# Patient Record
Sex: Female | Born: 1960
Health system: Southern US, Community
[De-identification: ages and names within clinical notes are randomized; demographics above are authoritative.]

## PROBLEM LIST (undated history)

## (undated) DIAGNOSIS — I10 Essential (primary) hypertension: Secondary | ICD-10-CM

## (undated) HISTORY — PX: ABDOMINAL HYSTERECTOMY: SHX81

---

## 2002-04-26 ENCOUNTER — Ambulatory Visit (HOSPITAL_BASED_OUTPATIENT_CLINIC_OR_DEPARTMENT_OTHER): Admission: RE | Admit: 2002-04-26 | Discharge: 2002-04-26 | Payer: Self-pay | Admitting: Obstetrics and Gynecology

## 2002-06-18 ENCOUNTER — Inpatient Hospital Stay (HOSPITAL_COMMUNITY): Admission: RE | Admit: 2002-06-18 | Discharge: 2002-06-20 | Payer: Self-pay | Admitting: Obstetrics and Gynecology

## 2013-11-10 ENCOUNTER — Emergency Department (HOSPITAL_COMMUNITY)
Admission: EM | Admit: 2013-11-10 | Discharge: 2013-11-10 | Disposition: A | Discharge status: Home / Self Care | Payer: BC Managed Care – PPO | Attending: Emergency Medicine | Admitting: Emergency Medicine

## 2013-11-10 ENCOUNTER — Emergency Department (HOSPITAL_COMMUNITY): Payer: BC Managed Care – PPO

## 2013-11-10 ENCOUNTER — Encounter (HOSPITAL_COMMUNITY): Payer: Self-pay | Admitting: *Deleted

## 2013-11-10 DIAGNOSIS — S82001A Unspecified fracture of right patella, initial encounter for closed fracture: Secondary | ICD-10-CM

## 2013-11-10 DIAGNOSIS — Y9389 Activity, other specified: Secondary | ICD-10-CM | POA: Insufficient documentation

## 2013-11-10 DIAGNOSIS — S82031A Displaced transverse fracture of right patella, initial encounter for closed fracture: Secondary | ICD-10-CM | POA: Insufficient documentation

## 2013-11-10 DIAGNOSIS — W01198A Fall on same level from slipping, tripping and stumbling with subsequent striking against other object, initial encounter: Secondary | ICD-10-CM | POA: Insufficient documentation

## 2013-11-10 DIAGNOSIS — Y9289 Other specified places as the place of occurrence of the external cause: Secondary | ICD-10-CM | POA: Diagnosis not present

## 2013-11-10 DIAGNOSIS — S0083XA Contusion of other part of head, initial encounter: Secondary | ICD-10-CM | POA: Diagnosis not present

## 2013-11-10 DIAGNOSIS — S82002A Unspecified fracture of left patella, initial encounter for closed fracture: Secondary | ICD-10-CM

## 2013-11-10 DIAGNOSIS — R52 Pain, unspecified: Secondary | ICD-10-CM

## 2013-11-10 DIAGNOSIS — S0990XA Unspecified injury of head, initial encounter: Secondary | ICD-10-CM | POA: Diagnosis present

## 2013-11-10 DIAGNOSIS — I10 Essential (primary) hypertension: Secondary | ICD-10-CM | POA: Diagnosis not present

## 2013-11-10 DIAGNOSIS — W010XXA Fall on same level from slipping, tripping and stumbling without subsequent striking against object, initial encounter: Secondary | ICD-10-CM

## 2013-11-10 DIAGNOSIS — S82032A Displaced transverse fracture of left patella, initial encounter for closed fracture: Secondary | ICD-10-CM | POA: Insufficient documentation

## 2013-11-10 HISTORY — DX: Essential (primary) hypertension: I10

## 2013-11-10 HISTORY — PX: JOINT ASPIRATION KNEE: PRO81

## 2013-11-10 MED ORDER — ONDANSETRON 8 MG PO TBDP
8.0000 mg | ORAL_TABLET | Freq: Once | ORAL | Status: AC
  Administered 2013-11-10: 8 mg via ORAL
  Filled 2013-11-10: qty 1

## 2013-11-10 MED ORDER — HYDROCODONE-ACETAMINOPHEN 5-325 MG PO TABS
2.0000 | ORAL_TABLET | Freq: Once | ORAL | Status: AC
  Administered 2013-11-10: 2 via ORAL
  Filled 2013-11-10: qty 2

## 2013-11-10 MED ORDER — OXYCODONE-ACETAMINOPHEN 5-325 MG PO TABS: 1.0000 | ORAL_TABLET | Freq: Four times a day (QID) | ORAL | Status: DC | PRN

## 2013-11-10 MED ORDER — LIDOCAINE HCL 1 % IJ SOLN
INTRAMUSCULAR | Status: AC
  Administered 2013-11-10: 20 mL
  Filled 2013-11-10: qty 20

## 2013-11-10 MED ORDER — OXYCODONE HCL 5 MG PO TABS: 5.0000 mg | ORAL_TABLET | ORAL | Status: DC | PRN

## 2013-11-10 NOTE — ED Notes (Signed)
Pt reports she tripped in her carport this morning around 0930, pt thinks she fell sideways, pt has large contusion to upper right foreheard and c/o bil knee pain 7/10. Pt alert and oriented x4, denies LOC.

## 2013-11-10 NOTE — Discharge Instructions (Signed)
It was our pleasure to provide your ER care today - we hope that you feel better.  Take motrin or aleve as need for pain. You may also take percocet as need for pain. No driving when taking percocet. Also, do not take tylenol or acetaminophen containing medication when taking percocet.  Wear knee immobilizers, use crutches. Elevate legs, ice/coldpacks to sore areas.   Follow up with orthopedist in the next couple days as arrange with them.  No driving until cleared to do so by your doctor/orthopedist.  Return to ER if worse, new symptoms, numbness/weakness, severe headache, persistent vomiting, other concern.    Fall Prevention and Home Safety Falls cause injuries and can affect all age groups. It is possible to use preventive measures to significantly decrease the likelihood of falls. There are many simple measures which can make your home safer and prevent falls. OUTDOORS  Repair cracks and edges of walkways and driveways.  Remove high doorway thresholds.  Trim shrubbery on the main path into your home.  Have good outside lighting.  Clear walkways of tools, rocks, debris, and clutter.  Check that handrails are not broken and are securely fastened. Both sides of steps should have handrails.  Have leaves, snow, and ice cleared regularly.  Use sand or salt on walkways during winter months.  In the garage, clean up grease or oil spills. BATHROOM  Install night lights.  Install grab bars by the toilet and in the tub and shower.  Use non-skid mats or decals in the tub or shower.  Place a plastic non-slip stool in the shower to sit on, if needed.  Keep floors dry and clean up all water on the floor immediately.  Remove soap buildup in the tub or shower on a regular basis.  Secure bath mats with non-slip, double-sided rug tape.  Remove throw rugs and tripping hazards from the floors. BEDROOMS  Install night lights.  Make sure a bedside light is easy to reach.  Do  not use oversized bedding.  Keep a telephone by your bedside.  Have a firm chair with side arms to use for getting dressed.  Remove throw rugs and tripping hazards from the floor. KITCHEN  Keep handles on pots and pans turned toward the center of the stove. Use back burners when possible.  Clean up spills quickly and allow time for drying.  Avoid walking on wet floors.  Avoid hot utensils and knives.  Position shelves so they are not too high or low.  Place commonly used objects within easy reach.  If necessary, use a sturdy step stool with a grab bar when reaching.  Keep electrical cables out of the way.  Do not use floor polish or wax that makes floors slippery. If you must use wax, use non-skid floor wax.  Remove throw rugs and tripping hazards from the floor. STAIRWAYS  Never leave objects on stairs.  Place handrails on both sides of stairways and use them. Fix any loose handrails. Make sure handrails on both sides of the stairways are as long as the stairs.  Check carpeting to make sure it is firmly attached along stairs. Make repairs to worn or loose carpet promptly.  Avoid placing throw rugs at the top or bottom of stairways, or properly secure the rug with carpet tape to prevent slippage. Get rid of throw rugs, if possible.  Have an electrician put in a light switch at the top and bottom of the stairs. OTHER FALL PREVENTION TIPS  Wear low-heel  or rubber-soled shoes that are supportive and fit well. Wear closed toe shoes.  When using a stepladder, make sure it is fully opened and both spreaders are firmly locked. Do not climb a closed stepladder.  Add color or contrast paint or tape to grab bars and handrails in your home. Place contrasting color strips on first and last steps.  Learn and use mobility aids as needed. Install an electrical emergency response system.  Turn on lights to avoid dark areas. Replace light bulbs that burn out immediately. Get light  switches that glow.  Arrange furniture to create clear pathways. Keep furniture in the same place.  Firmly attach carpet with non-skid or double-sided tape.  Eliminate uneven floor surfaces.  Select a carpet pattern that does not visually hide the edge of steps.  Be aware of all pets. OTHER HOME SAFETY TIPS 1. Set the water temperature for 120 F (48.8 C). 2. Keep emergency numbers on or near the telephone. 3. Keep smoke detectors on every level of the home and near sleeping areas. Document Released: 12/11/2001 Document Revised: 06/22/2011 Document Reviewed: 03/12/2011 [facility] Patient Information 2015 [place], [place]. This information is not intended to replace advice given to you by your health care provider. Make sure you discuss any questions you have with your health care provider.     Patellar Fracture, Adult A patellar fracture is a break in your kneecap (patella).  CAUSES   A direct blow to the knee or a fall is usually the cause of a broken patella.  A very hard and strong bending of your knee can cause a patellar fracture. RISK FACTORS Involvement in contact sports, especially sports that involve a lot of jumping. SIGNS AND SYMPTOMS   Tender and swollen knee.  Pain when you move your knee, especially when you try to straighten out your leg.  Difficulty walking or putting weight on your knee.  Misshapen knee (as if a bone is out of place). DIAGNOSIS  Patellar fracture is usually diagnosed with a physical exam and an X-ray exam. TREATMENT  Treatment depends on the type of fracture:  If your patella is still in the right position after the fracture and you can still straighten your leg out, you can usually be treated with a splint or cast for 4-6 weeks.  If your patella is broken into multiple small pieces but you are able to straighten your leg, you can usually be treated with a splint or cast for 4-6 weeks. Sometimes your patella may need to be removed before  the cast is applied.  If you cannot straighten out your leg after a patellar fracture, then surgery is required to hold the bony fragments together until they heal. A cast or splint will be applied for 4-6 weeks. HOME CARE INSTRUCTIONS   Only take over-the-counter or prescription medicines for pain, discomfort, or fever as directed by your health care provider.  Use crutches as directed, and exercise the leg as directed.  Apply ice to the injured area:  Put ice in a plastic bag.  Place a towel between your skin and the bag.  Leave the ice on for 20 minutes, 2-3 times a day.  Elevate the affected knee above the level of your heart. SEEK MEDICAL CARE IF:  You suspect you have significantly injured your knee.  You hear a pop after a knee injury.  Your knee is misshapen after a knee injury.  You have pain when you move your knee.  You have difficulty walking  or putting weight on your knee.  You cannot fully move your knee. SEEK IMMEDIATE MEDICAL CARE IF:  You have redness, swelling, or increasing pain in your knee.  You have a fever. Document Released: 09/19/2002 Document Revised: 10/11/2012 Document Reviewed: 08/02/2012 [facility] Patient Information 2015 [place], [place]. This information is not intended to replace advice given to you by your health care provider. Make sure you discuss any questions you have with your health care provider.    Knee Immobilizer A knee immobilizer is used to support and protect an injured or painful knee. Knee immobilizers keep your knee from being used while it is healing. Some of the common immobilizers used include splints (air, plaster, fiberglass, stiff cloth, or aluminum) or casts. Wear your knee immobilizer as instructed and only remove it as instructed. HOME CARE INSTRUCTIONS   Use absorbent powder (such as baby powder or talcum powder) to control irritation from sweat and friction.  Adjust the immobilizer to be firm but not tight.  Signs of an immobilizer that is too tight include:  Swelling.  Numbness.  Color change in your foot or ankle.  Increased pain.  While resting, raise your leg above the level of your heart. Pillows can be used for support. This reduces throbbing and helps healing.  Remove the immobilizer to bathe and sleep. SEEK MEDICAL CARE IF:   You have increasing pain or swelling in the knee, foot, or ankle.  You have problems caused by the knee immobilizer, or it breaks or needs replacement. MAKE SURE YOU:   Understand these instructions.  Will watch your condition.  Will get help right away if you are not doing well or get worse. Document Released: 12/21/2004 Document Revised: 05/07/2013 Document Reviewed: 08/14/2012 [facility] Patient Information 2015 [place], [place]. This information is not intended to replace advice given to you by your health care provider. Make sure you discuss any questions you have with your health care provider.    Crutch Use Crutches are used to take weight off one of your legs or feet when you stand or walk. It is important to use crutches that fit properly. When fitted properly:  Each crutch should be 2-3 finger widths below the armpit.  Your weight should be supported by your hand, and not by resting the armpit on the crutch.  RISKS AND COMPLICATIONS Damage to the nerves that extend from your armpit to your hand and arm. To prevent this from happening, make sure your crutches fit properly and do not put pressure on your armpit when using them. HOW TO USE YOUR CRUTCHES If you have been instructed to use partial weight bearing, apply (bear) the amount of weight as your health care provider suggests. Do not bear weight in an amount that causes pain to the area of injury. Walking  Step with the crutches.  Swing the healthy leg slightly ahead of the crutches. Going Up Steps If there is no handrail:  Step up with the healthy leg.  Step up with the crutches  and injured leg.  Continue in this way. If there is a handrail:  Hold both crutches in one hand.  Place your free hand on the handrail.  While putting your weight on your arms, lift your healthy leg to the step.  Bring the crutches and the injured leg up to that step.  Continue in this way. Going Down Steps Be very careful, as going down stairs with crutches is very challenging. If there is no handrail:  Step down with  the injured leg and crutches.  Step down with the healthy leg. If there is a handrail:  Place your hand on the handrail.  Hold both crutches with your free hand.  Lower your injured leg and crutch to the step below you. Make sure to keep the crutch tips in the center of the step, never on the edge.  Lower your healthy leg to that step.  Continue in this way. Standing Up 4. Hold the injured leg forward. 5. Grab the armrest with one hand and the top of the crutches with the other hand. 6. Using these supports, pull yourself up to a standing position. Sitting Down 1. Hold the injured leg forward. 2. Grab the armrest with one hand and the top of the crutches with the other hand. 3. Lower yourself to a sitting position. SEEK MEDICAL CARE IF:  You still feel unsteady on your feet.  You develop new pain, for example in your armpits, back, shoulder, wrist, or hip.  You develop any numbness or tingling. SEEK IMMEDIATE MEDICAL CARE IF: You fall. Document Released: 12/19/1999 Document Revised: 12/26/2012 Document Reviewed: 08/28/2012 [facility] Patient Information 2015 [place], [place]. This information is not intended to replace advice given to you by your health care provider. Make sure you discuss any questions you have with your health care provider.    Facial or Scalp Contusion A facial or scalp contusion is a deep bruise on the face or head. Injuries to the face and head generally cause a lot of swelling, especially around the eyes. Contusions are the  result of an injury that caused bleeding under the skin. The contusion may turn blue, purple, or yellow. Minor injuries will give you a painless contusion, but more severe contusions may stay painful and swollen for a few weeks.  CAUSES  A facial or scalp contusion is caused by a blunt injury or trauma to the face or head area.  SIGNS AND SYMPTOMS   Swelling of the injured area.   Discoloration of the injured area.   Tenderness, soreness, or pain in the injured area.  DIAGNOSIS  The diagnosis can be made by taking a medical history and doing a physical exam. An X-ray exam, CT scan, or MRI may be needed to determine if there are any associated injuries, such as broken bones (fractures). TREATMENT  Often, the best treatment for a facial or scalp contusion is applying cold compresses to the injured area. Over-the-counter medicines may also be recommended for pain control.  HOME CARE INSTRUCTIONS   Only take over-the-counter or prescription medicines as directed by your health care provider.   Apply ice to the injured area.   Put ice in a plastic bag.   Place a towel between your skin and the bag.   Leave the ice on for 20 minutes, 2-3 times a day.  SEEK MEDICAL CARE IF:  You have bite problems.   You have pain with chewing.   You are concerned about facial defects. SEEK IMMEDIATE MEDICAL CARE IF:  You have severe pain or a headache that is not relieved by medicine.   You have unusual sleepiness, confusion, or personality changes.   You throw up (vomit).   You have a persistent nosebleed.   You have double vision or blurred vision.   You have fluid drainage from your nose or ear.   You have difficulty walking or using your arms or legs.  MAKE SURE YOU:   Understand these instructions.  Will watch your condition.  Will get help right away if you are not doing well or get worse. Document Released: 01/29/2004 Document Revised: 10/11/2012 Document  Reviewed: 08/03/2012 [facility] Patient Information 2015 [place], [place]. This information is not intended to replace advice given to you by your health care provider. Make sure you discuss any questions you have with your health care provider.    Cryotherapy Cryotherapy means treatment with cold. Ice or gel packs can be used to reduce both pain and swelling. Ice is the most helpful within the first 24 to 48 hours after an injury or flare-up from overusing a muscle or joint. Sprains, strains, spasms, burning pain, shooting pain, and aches can all be eased with ice. Ice can also be used when recovering from surgery. Ice is effective, has very few side effects, and is safe for most people to use. PRECAUTIONS  Ice is not a safe treatment option for people with:  Raynaud phenomenon. This is a condition affecting small blood vessels in the extremities. Exposure to cold may cause your problems to return.  Cold hypersensitivity. There are many forms of cold hypersensitivity, including:  Cold urticaria. Red, itchy hives appear on the skin when the tissues begin to warm after being iced.  Cold erythema. This is a red, itchy rash caused by exposure to cold.  Cold hemoglobinuria. Red blood cells break down when the tissues begin to warm after being iced. The hemoglobin that carry oxygen are passed into the urine because they cannot combine with blood proteins fast enough.  Numbness or altered sensitivity in the area being iced. If you have any of the following conditions, do not use ice until you have discussed cryotherapy with your caregiver:  Heart conditions, such as arrhythmia, angina, or chronic heart disease.  High blood pressure.  Healing wounds or open skin in the area being iced.  Current infections.  Rheumatoid arthritis.  Poor circulation.  Diabetes. Ice slows the blood flow in the region it is applied. This is beneficial when trying to stop inflamed tissues from spreading  irritating chemicals to surrounding tissues. However, if you expose your skin to cold temperatures for too long or without the proper protection, you can damage your skin or nerves. Watch for signs of skin damage due to cold. HOME CARE INSTRUCTIONS Follow these tips to use ice and cold packs safely.  Place a dry or damp towel between the ice and skin. A damp towel will cool the skin more quickly, so you may need to shorten the time that the ice is used.  For a more rapid response, add gentle compression to the ice.  Ice for no more than 10 to 20 minutes at a time. The bonier the area you are icing, the less time it will take to get the benefits of ice.  Check your skin after 5 minutes to make sure there are no signs of a poor response to cold or skin damage.  Rest 20 minutes or more between uses.  Once your skin is numb, you can end your treatment. You can test numbness by very lightly touching your skin. The touch should be so light that you do not see the skin dimple from the pressure of your fingertip. When using ice, most people will feel these normal sensations in this order: cold, burning, aching, and numbness.  Do not use ice on someone who cannot communicate their responses to pain, such as small children or people with dementia. HOW TO MAKE AN ICE PACK Ice packs are the  most common way to use ice therapy. Other methods include ice massage, ice baths, and cryosprays. Muscle creams that cause a cold, tingly feeling do not offer the same benefits that ice offers and should not be used as a substitute unless recommended by your caregiver. To make an ice pack, do one of the following:  Place crushed ice or a bag of frozen vegetables in a sealable plastic bag. Squeeze out the excess air. Place this bag inside another plastic bag. Slide the bag into a pillowcase or place a damp towel between your skin and the bag.  Mix 3 parts water with 1 part rubbing alcohol. Freeze the mixture in a  sealable plastic bag. When you remove the mixture from the freezer, it will be slushy. Squeeze out the excess air. Place this bag inside another plastic bag. Slide the bag into a pillowcase or place a damp towel between your skin and the bag. SEEK MEDICAL CARE IF:  You develop white spots on your skin. This may give the skin a blotchy (mottled) appearance.  Your skin turns blue or pale.  Your skin becomes waxy or hard.  Your swelling gets worse. MAKE SURE YOU:   Understand these instructions.  Will watch your condition.  Will get help right away if you are not doing well or get worse. Document Released: 08/17/2010 Document Revised: 05/07/2013 Document Reviewed: 08/17/2010 [facility] Patient Information 2015 [place], [place]. This information is not intended to replace advice given to you by your health care provider. Make sure you discuss any questions you have with your health care provider.

## 2013-11-10 NOTE — Progress Notes (Signed)
NCM contacted [facility] for DME delivered to ED 20, pt requesting RW, lightweight wheelchair, and 3n1 bedside commode. Husband at home to assist with care. Isidoro DonningAlesia Praise Dolecki RN CCM Case Mgmt phone [phone]

## 2013-11-10 NOTE — Procedures (Signed)
Procedure, risk and benefits explained, patient gave permission.  Time out performed.  Right knee prepped with betadine x 3.  Sterile gloves and technique used.  1% plain xylocaine used for local anesthesia.  18 gauge needle with syringe was inserted through right medial parapatellar area.  Aspirated 60 ml frank blood with fat globules.  Knee injected with 10cc 1% plain xylocaine at ent of aspiration.  Patient tolerated the procedure well.  Band aid placed.

## 2013-11-10 NOTE — Consult Note (Signed)
Kathleen CampbellPeter Whitfield, MD   Jacqualine CodeBrian Kaylib Furness, PA-C [address], [place], [place]  [id number]                             [phone]   ORTHOPAEDIC CONSULTATION  Leatrice Jewelseresa S Stege            MRN:  [id number] DOB/SEX:  1960/03/02/female    REQUESTING PHYSICIAN:  Dr Denton LankSteinl (Emergency Room Physician)  CHIEF COMPLAINT:  Painful bilateral knees  HISTORY: Kathleen Reedyeresa S Stevensis a 53 y.o. female with bilateral knee pain from recent fall this morning when she tripped over the dog in the carport landing on both knees and her head.  Apparently had a fairly significant hematoma right supraorbital area.  She went to get up and noticed soreness in both knees and then was unable to walk.  Husband got her up and transported her in a chair with her knees bent with the help of a friend.  Because of her pain they brought [facility] to [facility] where xrays revealed bilateral minimally displaced patella fractures.  We were called for her to be evaluated.  She denied any numbness in the legs.  Pain worsens with motion of knees.  Right knee swelled more than left and is also the more painful injury.  Previous IM nail Right femur at age 53.     PAST MEDICAL HISTORY: Patient Active Problem List   Diagnosis Date Noted  . Fracture of patella, left, closed 11/10/2013  . Fracture of patella, right, closed 11/10/2013   Past Medical History  Diagnosis Date  . Hypertension    Past Surgical History  Procedure Laterality Date  . Abdominal hysterectomy      MEDICATIONS:  No current facility-administered medications for this encounter. Current outpatient prescriptions: benazepril-hydrochlorthiazide (LOTENSIN HCT) 20-12.5 MG per tablet, Take 1 tablet daily.  famotidine (PEPCID) 20 MG tablet, Take 20 mg by mouth 2 (two) times daily. levothyroxine (SYNTHROID, LEVOTHROID) 50 MCG tablet, Take 50 mcg by mouth daily before   ALLERGIES:  No Known Allergies  REVIEW OF SYSTEMS: REVIEWED IN DETAIL IN CHART  H/O htn and  hypothyroid  FAMILY HISTORY:  History reviewed. No pertinent family history.  SOCIAL HISTORY:   reports that she has never smoked. She does not have any smokeless tobacco history on file. She reports that she does not drink alcohol or use illicit drugs.   EXAMINATION: Vital signs in last 24 hours: Temp:  [97.7 F (36.5 C)] 97.7 F (36.5 C) (11/07 1120) Pulse Rate:  [81-96] 81 (11/07 1409) Resp:  [16-18] 16 (11/07 1409) BP: (123-148)/(89-94) 123/89 mmHg (11/07 1409) SpO2:  [97 %-100 %] 100 % (11/07 1409) Weight:  [58.968 kg (130 lb)] 58.968 kg (130 lb) (11/07 1409)  General appearance: alert, cooperative and moderate distress Head: Normocephalic, without obvious abnormality, atraumatic, swelling above right eye Eyes: negative findings: lids and lashes normal, conjunctivae and sclerae normal and corneas clear Ears: normal TM's and external ear canals both ears Nose: Nares normal. Septum midline. Mucosa normal. No drainage or sinus tenderness., no discharge Throat: lips, mucosa, and tongue normal; teeth and gums normal Neck: supple, symmetrical, trachea midline Lungs: clear to auscultation bilaterally Heart: regular rate and rhythm Extremities: Tender bilat knees,  hemarthrosis right knee and minimal left. Pulses: Right Pulses: DP: present 1+, PT: present 1+ Left Pulses: DP: present 1+, PT: present 1+ Skin: Skin color, texture, turgor normal. No rashes or lesions   DIAGNOSTIC STUDIES: Recent laboratory  studies: None   Recent Radiographic Studies :  Ct Head Wo Contrast  11/10/2013   CLINICAL DATA:  53 year old female with forehead contusion status post falling in her car port this morning  EXAM: CT HEAD WITHOUT CONTRAST  TECHNIQUE: Contiguous axial images were obtained from the base of the skull through the vertex without intravenous contrast.  COMPARISON:  None.  FINDINGS: Negative for acute intracranial hemorrhage, acute infarction, mass, mass effect, hydrocephalus or midline  shift. Gray-white differentiation is preserved throughout. Moderately large right frontal scalp hematoma without evidence of underlying fracture. Visualized globes and orbits are symmetric and unremarkable bilaterally. Normal aeration of the mastoid air cells and visualized paranasal sinuses.  IMPRESSION: 1. No evidence of acute intracranial abnormality. 2. Right frontal scalp hematoma without evidence of underlying fracture.   Electronically Signed   By: Malachy MoanHeath  McCullough M.D.   On: 11/10/2013 12:27   Dg Knee Complete 4 Views Left  11/10/2013   CLINICAL DATA:  Tripped and fell  EXAM: LEFT KNEE - COMPLETE 4+ VIEW  COMPARISON:  Contralateral right knee radiographs same date  FINDINGS: There is a mildly comminuted predominantly transverse patellar fracture with small suprapatellar effusion and prepatellar soft tissue swelling. No radiopaque foreign body. No dislocation.  IMPRESSION: Predominantly transverse patellar fracture.   Electronically Signed   By: Christiana PellantGretchen  Green M.D.   On: 11/10/2013 12:43   Dg Knee Complete 4 Views Right  11/10/2013   CLINICAL DATA:  tripped and fell, bilateral knee pain  EXAM: RIGHT KNEE - COMPLETE 4+ VIEW  COMPARISON:  None.  FINDINGS: Incomplete imaging of an intra medullary femoral rod noted. There is a mildly comminuted patellar fracture with predominantly a transverse component. Small suprapatellar joint effusion noted with prepatellar soft tissue swelling evident.  IMPRESSION: Mildly comminuted predominantly transverse patellar fracture.   Electronically Signed   By: Christiana PellantGretchen  Green M.D.   On: 11/10/2013 12:41   Dg Finger Index Left  11/10/2013   CLINICAL DATA:  Tripped and fell, hand pain  EXAM: LEFT INDEX FINGER 2+V  COMPARISON:  None.  FINDINGS: There is no evidence of fracture or dislocation. There is no evidence of arthropathy or other focal bone abnormality. Soft tissues are unremarkable.  IMPRESSION: Negative.   Electronically Signed   By: Christiana PellantGretchen  Green M.D.   On:  11/10/2013 12:43    ASSESSMENT: Bilateral minimally displaced patella fractures       Hematoma right supraorbital area   PLAN: 1 - Bilateral long knee immobilizers  2 - WBAT for transfers  3 - No flexion of knees  4 - Oxy IR for pain  5 - Wheel chair, walker, bedside comode  6 - Return to office on Tuesday afternoon for reevaluation  Westley Blass 11/10/2013, 2:55 PM

## 2013-11-10 NOTE — ED Notes (Signed)
Knee immobilizer applied, bilat. Pt tolerated well. Resting quietly.

## 2013-11-10 NOTE — ED Provider Notes (Signed)
CSN: [id number]     Arrival date & time 11/10/13  1116 History   First MD Initiated Contact with Patient 11/10/13 1129     Chief Complaint  Patient presents with  . Fall  . head contusion   . Knee Pain     (Consider location/radiation/quality/duration/timing/severity/associated sxs/prior Treatment) Patient is a 53 y.o. female presenting with fall and knee pain. The history is provided by the patient.  Fall Associated symptoms include headaches. Pertinent negatives include no chest pain, no abdominal pain and no shortness of breath.  Knee Pain Associated symptoms: no back pain, no fever and no neck pain   pt c/o trip and fall just pta today. States tripped over dog in carport and fell forward landing onto knees and hitting head. Was dazed. Large contusion to forehead. Constant pain bil knees anteriorly. Moderate, dull, constant, worse w palpation, not radiating. +headache post contusion/fall. Felt normal, well, at baseline, just prior to fall. No faintness or dizziness. Denies neck or back pain. Also c/o pain to distal phalanx of left index finger. Skin intact.      Past Medical History  Diagnosis Date  . Hypertension    Past Surgical History  Procedure Laterality Date  . Abdominal hysterectomy     History reviewed. No pertinent family history. History  Substance Use Topics  . Smoking status: Never Smoker   . Smokeless tobacco: Not on file  . Alcohol Use: No   OB History    No data available     Review of Systems  Constitutional: Negative for fever and chills.  HENT: Negative for nosebleeds.   Eyes: Negative for pain and visual disturbance.  Respiratory: Negative for shortness of breath.   Cardiovascular: Negative for chest pain.  Gastrointestinal: Negative for nausea, vomiting and abdominal pain.  Genitourinary: Negative for flank pain.  Musculoskeletal: Negative for back pain and neck pain.  Skin: Negative for wound.  Neurological: Positive for headaches. Negative  for syncope, weakness and numbness.  Hematological: Does not bruise/bleed easily.  Psychiatric/Behavioral: Negative for confusion.      Allergies  Review of patient's allergies indicates no known allergies.  Home Medications   Prior to Admission medications   Not on File   BP 148/94 mmHg  Pulse 96  Temp(Src) 97.7 F (36.5 C) (Oral)  Resp 18  SpO2 97% Physical Exam  Constitutional: She is oriented to person, place, and time. She appears well-developed and well-nourished. No distress.  HENT:  Large contusion to forehead. Facial bones/orbits intact.   Eyes: Conjunctivae are normal. Pupils are equal, round, and reactive to light. No scleral icterus.  Neck: Normal range of motion. Neck supple. No tracheal deviation present.  Cardiovascular: Normal rate, regular rhythm, normal heart sounds and intact distal pulses.   Pulmonary/Chest: Effort normal and breath sounds normal. No respiratory distress. She exhibits no tenderness.  Abdominal: Soft. Normal appearance. She exhibits no distension. There is no tenderness.  Musculoskeletal: She exhibits no edema.  Tenderness and sts bil knee anteriorly. Knees stable. No pain w rom at hip or ankle. Mild pain w rom at knees. Distal pulses palp.  CTLS spine, non tender, aligned, no step off.   Neurological: She is alert and oriented to person, place, and time.  Motor intact bil, stre 5/5.  Moves bil ext purposefully.   Skin: Skin is warm and dry. No rash noted. She is not diaphoretic.  Psychiatric: She has a normal mood and affect.  Nursing note and vitals reviewed.   ED Course  Procedures (including critical care time) Labs Review  Ct Head Wo Contrast  11/10/2013   CLINICAL DATA:  53 year old female with forehead contusion status post falling in her car port this morning  EXAM: CT HEAD WITHOUT CONTRAST  TECHNIQUE: Contiguous axial images were obtained from the base of the skull through the vertex without intravenous contrast.  COMPARISON:   None.  FINDINGS: Negative for acute intracranial hemorrhage, acute infarction, mass, mass effect, hydrocephalus or midline shift. Gray-white differentiation is preserved throughout. Moderately large right frontal scalp hematoma without evidence of underlying fracture. Visualized globes and orbits are symmetric and unremarkable bilaterally. Normal aeration of the mastoid air cells and visualized paranasal sinuses.  IMPRESSION: 1. No evidence of acute intracranial abnormality. 2. Right frontal scalp hematoma without evidence of underlying fracture.   Electronically Signed   By: Malachy MoanHeath  McCullough M.D.   On: 11/10/2013 12:27   Dg Knee Complete 4 Views Left  11/10/2013   CLINICAL DATA:  Tripped and fell  EXAM: LEFT KNEE - COMPLETE 4+ VIEW  COMPARISON:  Contralateral right knee radiographs same date  FINDINGS: There is a mildly comminuted predominantly transverse patellar fracture with small suprapatellar effusion and prepatellar soft tissue swelling. No radiopaque foreign body. No dislocation.  IMPRESSION: Predominantly transverse patellar fracture.   Electronically Signed   By: Christiana PellantGretchen  Green M.D.   On: 11/10/2013 12:43   Dg Knee Complete 4 Views Right  11/10/2013   CLINICAL DATA:  tripped and fell, bilateral knee pain  EXAM: RIGHT KNEE - COMPLETE 4+ VIEW  COMPARISON:  None.  FINDINGS: Incomplete imaging of an intra medullary femoral rod noted. There is a mildly comminuted patellar fracture with predominantly a transverse component. Small suprapatellar joint effusion noted with prepatellar soft tissue swelling evident.  IMPRESSION: Mildly comminuted predominantly transverse patellar fracture.   Electronically Signed   By: Christiana PellantGretchen  Green M.D.   On: 11/10/2013 12:41   Dg Finger Index Left  11/10/2013   CLINICAL DATA:  Tripped and fell, hand pain  EXAM: LEFT INDEX FINGER 2+V  COMPARISON:  None.  FINDINGS: There is no evidence of fracture or dislocation. There is no evidence of arthropathy or other focal bone  abnormality. Soft tissues are unremarkable.  IMPRESSION: Negative.   Electronically Signed   By: Christiana PellantGretchen  Green M.D.   On: 11/10/2013 12:43     MDM  Xrays/imaging.  Confirmed nkda. No meds pta to ED.  Hydrocodone po.  icepack to forehead. Spine nt.   Reviewed nursing notes and prior charts for additional history.   bilat knee immobilizers.  Pain improved w meds.   Pt/family requests Dr Cleophas DunkerWhitfield for ortho.  Consulted ortho - ortho will see patient in ED.  Orthopedics has seen, and indicates help with epic ed d/c instructions - they request d/c to home w rx for percocet, and they plan to follow up in office in the next couple days.  Recheck pain improved/controlled.   Pt appears stable for d/c.     Suzi RootsKevin E Neah Sporrer, MD 11/10/13 [phone]

## 2013-11-10 NOTE — ED Notes (Signed)
Case Manager Helmut Musterlicia in ED and will see patient to arrange durable medical equipment, BCS, walker and W/C.

## 2013-11-10 NOTE — ED Notes (Signed)
Ice to knees bilaterally.  Assisted to fracture pan for urination. Pain to knees with movement. Spouse at bedside.

## 2013-11-10 NOTE — ED Notes (Signed)
Ortho tech at bedside 

## 2013-12-03 ENCOUNTER — Other Ambulatory Visit: Payer: Self-pay | Admitting: Orthopaedic Surgery

## 2013-12-03 ENCOUNTER — Ambulatory Visit
Admission: RE | Admit: 2013-12-03 | Discharge: 2013-12-03 | Disposition: A | Discharge status: Home / Self Care | Payer: BC Managed Care – PPO | Source: Ambulatory Visit | Attending: Orthopaedic Surgery | Admitting: Orthopaedic Surgery

## 2013-12-03 ENCOUNTER — Emergency Department (HOSPITAL_COMMUNITY)
Admission: EM | Admit: 2013-12-03 | Discharge: 2013-12-03 | Disposition: A | Discharge status: Home / Self Care | Payer: BC Managed Care – PPO | Attending: Emergency Medicine | Admitting: Emergency Medicine

## 2013-12-03 ENCOUNTER — Encounter (HOSPITAL_COMMUNITY): Payer: Self-pay | Admitting: *Deleted

## 2013-12-03 DIAGNOSIS — I82402 Acute embolism and thrombosis of unspecified deep veins of left lower extremity: Secondary | ICD-10-CM | POA: Insufficient documentation

## 2013-12-03 DIAGNOSIS — R000 Tachycardia, unspecified: Secondary | ICD-10-CM | POA: Diagnosis not present

## 2013-12-03 DIAGNOSIS — Z79899 Other long term (current) drug therapy: Secondary | ICD-10-CM | POA: Insufficient documentation

## 2013-12-03 DIAGNOSIS — I10 Essential (primary) hypertension: Secondary | ICD-10-CM | POA: Diagnosis not present

## 2013-12-03 DIAGNOSIS — M25562 Pain in left knee: Secondary | ICD-10-CM | POA: Insufficient documentation

## 2013-12-03 DIAGNOSIS — M25561 Pain in right knee: Secondary | ICD-10-CM | POA: Diagnosis not present

## 2013-12-03 DIAGNOSIS — M79662 Pain in left lower leg: Secondary | ICD-10-CM

## 2013-12-03 DIAGNOSIS — I824Z2 Acute embolism and thrombosis of unspecified deep veins of left distal lower extremity: Secondary | ICD-10-CM

## 2013-12-03 LAB — BASIC METABOLIC PANEL
Anion gap: 16 — ABNORMAL HIGH (ref 5–15)
BUN: 15 mg/dL (ref 6–23)
CALCIUM: 9.9 mg/dL (ref 8.4–10.5)
CO2: 27 mEq/L (ref 19–32)
CREATININE: 0.76 mg/dL (ref 0.50–1.10)
Chloride: 96 mEq/L (ref 96–112)
GFR calc Af Amer: >90 mL/min (ref >90)
GLUCOSE: 105 mg/dL — AB (ref 70–99)
Potassium: 3.8 mEq/L (ref 3.7–5.3)
Sodium: 139 mEq/L (ref 137–147)

## 2013-12-03 LAB — I-STAT TROPONIN, ED: Troponin i, poc: 0 ng/mL (ref 0.00–0.08)

## 2013-12-03 LAB — CBC WITH DIFFERENTIAL/PLATELET
Basophils Absolute: 0 10*3/uL (ref 0.0–0.1)
Basophils Relative: 0 % (ref 0–1)
EOS ABS: 0.2 10*3/uL (ref 0.0–0.7)
EOS PCT: 2 % (ref 0–5)
HCT: 37.8 % (ref 36.0–46.0)
Hemoglobin: 13.2 g/dL (ref 12.0–15.0)
LYMPHS ABS: 1.4 10*3/uL (ref 0.7–4.0)
Lymphocytes Relative: 13 % (ref 12–46)
MCH: 32.3 pg (ref 26.0–34.0)
MCHC: 34.9 g/dL (ref 30.0–36.0)
MCV: 92.4 fL (ref 78.0–100.0)
Monocytes Absolute: 1.2 10*3/uL — ABNORMAL HIGH (ref 0.1–1.0)
Monocytes Relative: 11 % (ref 3–12)
Neutro Abs: 8 10*3/uL — ABNORMAL HIGH (ref 1.7–7.7)
Neutrophils Relative %: 74 % (ref 43–77)
Platelets: 339 10*3/uL (ref 150–400)
RBC: 4.09 MIL/uL (ref 3.87–5.11)
RDW: 12.5 % (ref 11.5–15.5)
WBC: 10.8 10*3/uL — ABNORMAL HIGH (ref 4.0–10.5)

## 2013-12-03 LAB — PROTIME-INR
INR: 1.07 (ref 0.00–1.49)
Prothrombin Time: 14 seconds (ref 11.6–15.2)

## 2013-12-03 MED ORDER — RIVAROXABAN 15 MG PO TABS: 15.0000 mg | ORAL_TABLET | Freq: Two times a day (BID) | ORAL | Status: DC

## 2013-12-03 MED ORDER — RIVAROXABAN 15 MG PO TABS
15.0000 mg | ORAL_TABLET | Freq: Two times a day (BID) | ORAL | Status: DC
  Administered 2013-12-03: 15 mg via ORAL
  Filled 2013-12-03 (×2): qty 1

## 2013-12-03 MED ORDER — HYDROCODONE-ACETAMINOPHEN 5-325 MG PO TABS
1.0000 | ORAL_TABLET | Freq: Once | ORAL | Status: AC
  Administered 2013-12-03: 1 via ORAL
  Filled 2013-12-03: qty 1

## 2013-12-03 MED ORDER — SODIUM CHLORIDE 0.9 % IV BOLUS (SEPSIS)
1000.0000 mL | Freq: Once | INTRAVENOUS | Status: AC
  Administered 2013-12-03: 1000 mL via INTRAVENOUS

## 2013-12-03 NOTE — Discharge Instructions (Addendum)
Information on my medicine - XARELTO (rivaroxaban)  This medication education was reviewed with me or my healthcare representative as part of my discharge preparation.  The pharmacist that spoke with me during my hospital stay was:  Roseanne Reno, Cassie L, RPH  WHY WAS XARELTO PRESCRIBED FOR YOU? Xarelto was prescribed to treat blood clots that may have been found in the veins of your legs (deep vein thrombosis) or in your lungs (pulmonary embolism) and to reduce the risk of them occurring again.  What do you need to know about Xarelto? The starting dose is one 15 mg tablet taken TWICE daily with food for the FIRST 21 DAYS then on 12/24/13 the dose is changed to one 20 mg tablet taken ONCE A DAY with your evening meal.  DO NOT stop taking Xarelto without talking to the health care provider who prescribed the medication.  Refill your prescription for 20 mg tablets before you run out.  After discharge, you should have regular check-up appointments with your healthcare provider that is prescribing your Xarelto.  In the future your dose may need to be changed if your kidney function changes by a significant amount.  What do you do if you miss a dose? If you are taking Xarelto TWICE DAILY and you miss a dose, take it as soon as you remember. You may take two 15 mg tablets (total 30 mg) at the same time then resume your regularly scheduled 15 mg twice daily the next day.  If you are taking Xarelto ONCE DAILY and you miss a dose, take it as soon as you remember on the same day then continue your regularly scheduled once daily regimen the next day. Do not take two doses of Xarelto at the same time.   Important Safety Information Xarelto is a blood thinner medicine that can cause bleeding. You should call your healthcare provider right away if you experience any of the following: ? Bleeding from an injury or your nose that does not stop. ? Unusual colored urine (red or dark brown) or unusual  colored stools (red or black). ? Unusual bruising for unknown reasons. ? A serious fall or if you hit your head (even if there is no bleeding).  Some medicines may interact with Xarelto and might increase your risk of bleeding while on Xarelto. To help avoid this, consult your healthcare provider or pharmacist prior to using any new prescription or non-prescription medications, including herbals, vitamins, non-steroidal anti-inflammatory drugs (NSAIDs) and supplements.  This website has more information on Xarelto: VisitDestination.com.br.   Deep Vein Thrombosis A deep vein thrombosis (DVT) is a blood clot that develops in the deep, larger veins of the leg, arm, or pelvis. These are more dangerous than clots that might form in veins near the surface of the body. A DVT can lead to serious and even life-threatening complications if the clot breaks off and travels in the bloodstream to the lungs.  A DVT can damage the valves in your leg veins so that instead of flowing upward, the blood pools in the lower leg. This is called post-thrombotic syndrome, and it can result in pain, swelling, discoloration, and sores on the leg. CAUSES Usually, several things contribute to the formation of blood clots. Contributing factors include:  The flow of blood slows down.  The inside of the vein is damaged in some way.  You have a condition that makes blood clot more easily. RISK FACTORS Some people are more likely than others to develop blood clots. Risk  factors include:   Smoking.  Being overweight (obese).  Sitting or lying still for a long time. This includes long-distance travel, paralysis, or recovery from an illness or surgery. Other factors that increase risk are:   Older age, especially over 72 years of age.  Having a family history of blood clots or if you have already had a blot clot.  Having major or lengthy surgery. This is especially true for surgery on the hip, knee, or belly (abdomen). Hip  surgery is particularly high risk.  Having a long, thin tube (catheter) placed inside a vein during a medical procedure.  Breaking a hip or leg.  Having cancer or cancer treatment.  Pregnancy and childbirth.  Hormone changes make the blood clot more easily during pregnancy.  The fetus puts pressure on the veins of the pelvis.  There is a risk of injury to veins during delivery or a caesarean delivery. The risk is highest just after childbirth.  Medicines containing the female hormone estrogen. This includes birth control pills and hormone replacement therapy.  Other circulation or heart problems.  SIGNS AND SYMPTOMS When a clot forms, it can either partially or totally block the blood flow in that vein. Symptoms of a DVT can include:  Swelling of the leg or arm, especially if one side is much worse.  Warmth and redness of the leg or arm, especially if one side is much worse.  Pain in an arm or leg. If the clot is in the leg, symptoms may be more noticeable or worse when standing or walking. The symptoms of a DVT that has traveled to the lungs (pulmonary embolism, PE) usually start suddenly and include:  Shortness of breath.  Coughing.  Coughing up blood or blood-tinged mucus.  Chest pain. The chest pain is often worse with deep breaths.  Rapid heartbeat. Anyone with these symptoms should get emergency medical treatment right away. Do not wait to see if the symptoms will go away. Call your local emergency services (911 in the [place] if you have these symptoms. Do not drive yourself to the hospital. DIAGNOSIS If a DVT is suspected, your health care provider will take a full medical history and perform a physical exam. Tests that also may be required include:  Blood tests, including studies of the clotting properties of the blood.  Ultrasound to see if you have clots in your legs or lungs.  X-rays to show the flow of blood when dye is injected into the veins  (venogram).  Studies of your lungs if you have any chest symptoms. PREVENTION  Exercise the legs regularly. Take a brisk 30-minute walk every day.  Maintain a weight that is appropriate for your height.  Avoid sitting or lying in bed for long periods of time without moving your legs.  Women, particularly those over the age of 35 years, should consider the risks and benefits of taking estrogen medicines, including birth control pills.  Do not smoke, especially if you take estrogen medicines.  Long-distance travel can increase your risk of DVT. You should exercise your legs by walking or pumping the muscles every hour.  Many of the risk factors above relate to situations that exist with hospitalization, either for illness, injury, or elective surgery. Prevention may include medical and nonmedical measures.  Your health care provider will assess you for the need for venous thromboembolism prevention when you are admitted to the hospital. If you are having surgery, your surgeon will assess you the day of or day after  surgery. TREATMENT Once identified, a DVT can be treated. It can also be prevented in some circumstances. Once you have had a DVT, you may be at increased risk for a DVT in the future. The most common treatment for DVT is blood-thinning (anticoagulant) medicine, which reduces the blood's tendency to clot. Anticoagulants can stop new blood clots from forming and stop old clots from growing. They cannot dissolve existing clots. Your body does this by itself over time. Anticoagulants can be given by mouth, through an IV tube, or by injection. Your health care provider will determine the best program for you. Other medicines or treatments that may be used are:  Heparin or related medicines (low molecular weight heparin) are often the first treatment for a blood clot. They act quickly. However, they cannot be taken orally and must be given either in shot form or by IV tube.  Heparin can  cause a fall in a component of blood that stops bleeding and forms blood clots (platelets). You will be monitored with blood tests to be sure this does not occur.  Warfarin is an anticoagulant that can be swallowed. It takes a few days to start working, so usually heparin or related medicines are used in combination. Once warfarin is working, heparin is usually stopped.  Factor Xa inhibitor medicines, such as rivaroxaban and apixaban, also reduce blood clotting. These medicines are taken orally and can often be used without heparin or related medicines.  Less commonly, clot dissolving drugs (thrombolytics) are used to dissolve a DVT. They carry a high risk of bleeding, so they are used mainly in severe cases where your life or a part of your body is threatened.  Very rarely, a blood clot in the leg needs to be removed surgically.  If you are unable to take anticoagulants, your health care provider may arrange for you to have a filter placed in a main vein in your abdomen. This filter prevents clots from traveling to your lungs. HOME CARE INSTRUCTIONS  Take all medicines as directed by your health care provider.  Learn as much as you can about DVT.  Wear a medical alert bracelet or carry a medical alert card.  Ask your health care provider how soon you can go back to normal activities. It is important to stay active to prevent blood clots. If you are on anticoagulant medicine, avoid contact sports.  It is very important to exercise. This is especially important while traveling, sitting, or standing for long periods of time. Exercise your legs by walking or by tightening and relaxing your leg muscles regularly. Take frequent walks.  You may need to wear compression stockings. These are tight elastic stockings that apply pressure to the lower legs. This pressure can help keep the blood in the legs from clotting. Taking Warfarin Warfarin is a daily medicine that is taken by mouth. Your health  care provider will advise you on the length of treatment (usually 3-6 months, sometimes lifelong). If you take warfarin:  Understand how to take warfarin and foods that can affect how warfarin works in Public relations account executiveyour body.  Too much and too little warfarin are both dangerous. Too much warfarin increases the risk of bleeding. Too little warfarin continues to allow the risk for blood clots. Warfarin and Regular Blood Testing While taking warfarin, you will need to have regular blood tests to measure your blood clotting time. These blood tests usually include both the prothrombin time (PT) and international normalized ratio (INR) tests. The PT and INR  results allow your health care provider to adjust your dose of warfarin. It is very important that you have your PT and INR tested as often as directed by your health care provider.  Warfarin and Your Diet Avoid major changes in your diet, or notify your health care provider before changing your diet. Arrange a visit with a registered dietitian to answer your questions. Many foods, especially foods high in vitamin K, can interfere with warfarin and affect the PT and INR results. You should eat a consistent amount of foods high in vitamin K. Foods high in vitamin K include:   Spinach, kale, broccoli, cabbage, collard and turnip greens, Brussels sprouts, peas, cauliflower, seaweed, and parsley.  Beef and pork liver.  Green tea.  Soybean oil. Warfarin with Other Medicines Many medicines can interfere with warfarin and affect the PT and INR results. You must:  Tell your health care provider about any and all medicines, vitamins, and supplements you take, including aspirin and other over-the-counter anti-inflammatory medicines. Be especially cautious with aspirin and anti-inflammatory medicines. Ask your health care provider before taking these.  Do not take or discontinue any prescribed or over-the-counter medicine except on the advice of your health care  provider or pharmacist. Warfarin Side Effects Warfarin can have side effects, such as easy bruising and difficulty stopping bleeding. Ask your health care provider or pharmacist about other side effects of warfarin. You will need to:  Hold pressure over cuts for longer than usual.  Notify your dentist and other health care providers that you are taking warfarin before you undergo any procedures where bleeding may occur. Warfarin with Alcohol and Tobacco   Drinking alcohol frequently can increase the effect of warfarin, leading to excess bleeding. It is best to avoid alcoholic drinks or to consume only very small amounts while taking warfarin. Notify your health care provider if you change your alcohol intake.   Do not use any tobacco products including cigarettes, chewing tobacco, or electronic cigarettes. If you smoke, quit. Ask your health care provider for help with quitting smoking. Alternative Medicines to Warfarin: Factor Xa Inhibitor Medicines  These blood-thinning medicines are taken by mouth, usually for several weeks or longer. It is important to take the medicine every single day at the same time each day.  There are no regular blood tests required when using these medicines.  There are fewer food and drug interactions than with warfarin.  The side effects of this class of medicine are similar to those of warfarin, including excessive bruising or bleeding. Ask your health care provider or pharmacist about other potential side effects. SEEK MEDICAL CARE IF:  You notice a rapid heartbeat.  You feel weaker or more tired than usual.  You feel faint.  You notice increased bruising.  You feel your symptoms are not getting better in the time expected.  You believe you are having side effects of medicine. SEEK IMMEDIATE MEDICAL CARE IF:  You have chest pain.  You have trouble breathing.  You have new or increased swelling or pain in one leg.  You cough up blood.  You  notice blood in vomit, in a bowel movement, or in urine. MAKE SURE YOU:  Understand these instructions.  Will watch your condition.  Will get help right away if you are not doing well or get worse. Document Released: 12/21/2004 Document Revised: 05/07/2013 Document Reviewed: 08/28/2012 [facility] Patient Information 2015 [place] [place]. This information is not intended to replace advice given to you by your health care  provider. Make sure you discuss any questions you have with your health care provider. ° °

## 2013-12-03 NOTE — ED Notes (Signed)
Pt c/o L calf pain starting 12/02/13. Pt had bilateral patellar fractures on 11/10/13; bilateral knee sleeves present. [facility] sees Dr[facility]Whitfield [facility] Doppler positive for DVT in " left distal femoral, popliteal, and tibial" per radiologist. Denies chest pain or shortness of breath.

## 2013-12-03 NOTE — ED Provider Notes (Signed)
CSN: [id number]     Arrival date & time 12/03/13  1631 History   First MD Initiated Contact with Patient 12/03/13 1659     Chief Complaint  Patient presents with  . DVT     (Consider location/radiation/quality/duration/timing/severity/associated sxs/prior Treatment) HPI 53 year old female with recent bilateral patellar fractures presents with DVT. Patient had a gradual onset of left lower leg pain over the past 36 hours. This morning she called her orthopedic surgeon, who sent her for a DVT ultrasound. Is positive for clot in all left popliteal fossa to the inguinal canal. She denies any chest pain or shortness of breath. She complains of pain in her leg only, says it is currently a 5 out of 10. She has noted some subjective swelling, but no redness.   Past Medical History  Diagnosis Date  . Hypertension    Past Surgical History  Procedure Laterality Date  . Abdominal hysterectomy    . Joint aspiration knee  11/10/2013        History reviewed. No pertinent family history. History  Substance Use Topics  . Smoking status: Never Smoker   . Smokeless tobacco: Not on file  . Alcohol Use: No   OB History    No data available     Review of Systems  Respiratory: Negative for shortness of breath.   Cardiovascular: Negative for chest pain.  Musculoskeletal: Positive for myalgias, joint swelling and arthralgias.  Skin: Negative for color change and wound.  All other systems reviewed and are negative.     Allergies  Review of patient's allergies indicates no known allergies.  Home Medications   Prior to Admission medications   Medication Sig Start Date End Date Taking? Authorizing Provider  acetaminophen (TYLENOL) 325 MG tablet Take 650 mg by mouth every 6 (six) hours as needed for mild pain or moderate pain.   Yes Historical Provider, MD  benazepril-hydrochlorthiazide (LOTENSIN HCT) 20-12.5 MG per tablet Take 1 tablet by mouth daily.   Yes Historical Provider, MD   levothyroxine (SYNTHROID, LEVOTHROID) 50 MCG tablet Take 50 mcg by mouth daily before breakfast.   Yes Historical Provider, MD  oxyCODONE-acetaminophen (PERCOCET/ROXICET) 5-325 MG per tablet Take 1-2 tablets by mouth every 6 (six) hours as needed for severe pain. 11/10/13  Yes Suzi RootsKevin E Steinl, MD  Rivaroxaban (XARELTO) 15 MG TABS tablet Take 1 tablet (15 mg total) by mouth 2 (two) times daily. 12/03/13 12/24/13  Erskine Emeryhris Sergio Zawislak, MD   BP 113/70 mmHg  Pulse 113  Temp(Src) 99 F (37.2 C) (Oral)  Resp 20  SpO2 97% Physical Exam  Constitutional: She is oriented to person, place, and time. She appears well-developed and well-nourished.  HENT:  Head: Normocephalic and atraumatic.  Eyes: Conjunctivae are normal. Pupils are equal, round, and reactive to light.  Neck: Normal range of motion. No JVD present.  Cardiovascular: Regular rhythm, normal heart sounds and intact distal pulses.   Tachycardia  Pulmonary/Chest: Effort normal and breath sounds normal. No respiratory distress.  Abdominal: Soft. Bowel sounds are normal. There is no tenderness.  Musculoskeletal:  Tenderness to bilateral patella  Palpable DP and PT pulses in both feet, left lower extremity does not appear any more swollen than the right No signs of warmth or thrombophlebitis in the left lower extremity  Neurological: She is alert and oriented to person, place, and time.  Skin: Skin is warm and dry.  Psychiatric: She has a normal mood and affect.  Nursing note and vitals reviewed.   ED Course  Procedures (  including critical care time) Labs Review Labs Reviewed  BASIC METABOLIC PANEL - Abnormal; Notable for the following:    Glucose, Bld 105 (*)    Anion gap 16 (*)    All other components within normal limits  CBC WITH DIFFERENTIAL - Abnormal; Notable for the following:    WBC 10.8 (*)    Neutro Abs 8.0 (*)    Monocytes Absolute 1.2 (*)    All other components within normal limits  PROTIME-INR  I-STAT TROPOININ, ED     Imaging Review [place] Venous Img Lower Unilateral Left  12/03/2013   CLINICAL DATA:  Acute left calf pain, swelling and tenderness  EXAM: LEFT LOWER EXTREMITY VENOUS DOPPLER ULTRASOUND  TECHNIQUE: Gray-scale sonography with graded compression, as well as color Doppler and duplex ultrasound were performed to evaluate the lower extremity deep venous systems from the level of the common femoral vein and including the common femoral, femoral, profunda femoral, popliteal and calf veins including the posterior tibial, peroneal and gastrocnemius veins when visible. The superficial great saphenous vein was also interrogated. Spectral Doppler was utilized to evaluate flow at rest and with distal augmentation maneuvers in the common femoral, femoral and popliteal veins.  COMPARISON:  None.  FINDINGS: Contralateral Common Femoral Vein: Respiratory phasicity is normal and symmetric with the symptomatic side. No evidence of thrombus. Normal compressibility.  Common Femoral Vein: No evidence of thrombus. Normal compressibility, respiratory phasicity and response to augmentation.  Saphenofemoral Junction: No evidence of thrombus. Normal compressibility and flow on color Doppler imaging.  Profunda Femoral Vein: No evidence of thrombus. Normal compressibility and flow on color Doppler imaging.  Femoral Vein: Positive for hypoechoic intraluminal thrombus in the mid to distal superficial femoral vein appearing nonocclusive. Vessel remains partially compressible. Augmentation not performed.  Popliteal Vein: Hypoechoic intraluminal thrombus appears more extensive in the popliteal vein and is occlusive. Vessel is noncompressible. Augmentation not performed.  Calf Veins: Hypoechoic occlusive thrombus extends into the calf tibial veins. Peroneal vein not well visualized.  Superficial Great Saphenous Vein: No evidence of thrombus. Normal compressibility and flow on color Doppler imaging.  Venous Reflux:  None.  Other Findings:  None.   IMPRESSION: Positive exam for left lower extremity distal femoral, popliteal and tibial DVT. Thrombus appears occlusive in the popliteal and tibial veins.  These results will be called to the ordering clinician or representative by the Radiology Department at the imaging location.   Electronically Signed   By: Ruel Favorsrevor  Shick M.D.   On: 12/03/2013 15:53     EKG Interpretation None      MDM   Final diagnoses:  DVT, lower extremity, distal, acute, left   53 year old female presenting with acute left lower extremity DVT in the context of immobilization. Patient has had no shortness of breath chest pain or hypoxia, no cough, no clinical signs of pulmonary embolism. Patient has no elevated troponin of right heart strain on her EKG.Remainder of laboratory evaluations all within normal limits, creatinine normal. Remained hemodynamic stable here in the emergency department, tachycardia resolved with control of pain. Initiated [place], counseled the patient on its use, and will discharge her with a prescription for this. She will follow-up with her primary care doctor within 2 weeks for recheck.  Erskine Emeryhris Kenzy Campoverde, MD 12/04/13 [id number]  Hilario Quarryanielle S Ray, MD 12/06/13 [phone]

## 2013-12-03 NOTE — ED Notes (Signed)
Pt in after a positive doppler study indicated a DVT in there left lower leg, pt has been in bilateral knee immobilizers since nov 7th, started having new pain to back of left leg two days ago

## 2013-12-03 NOTE — ED Notes (Signed)
Dr Davis at bedside.

## 2013-12-03 NOTE — Progress Notes (Signed)
ANTICOAGULATION CONSULT NOTE - Initial Consult  Pharmacy Consult for Xarelto Indication: DVT  No Known Allergies  Patient Measurements:  Vital Signs: Temp: 98.3 F (36.8 C) (11/30 1637) Temp Source: Oral (11/30 1637) BP: 117/75 mmHg (11/30 1815) Pulse Rate: 117 (11/30 1815)  Labs:  Recent Labs  12/03/13 1814  HGB 13.2  HCT 37.8  PLT 339  CREATININE 0.76    CrCl cannot be calculated (Unknown ideal weight.).   Medical History: Past Medical History  Diagnosis Date  . Hypertension     Medications:  Scheduled:  . Rivaroxaban  15 mg Oral BID WC    Assessment: 53 yo f who presented to the ED on 11/30 for left calf pain.  Pharmacy is consulted to begin Xarelto for new DVT.  [facility]  Hgb 13.2, plts 339, no bleeding noted.   Goal of Therapy:  Monitor platelets by anticoagulation protocol: Yes   Plan:  Xarelto 15 mg PO BID with food for 21 days Followed by 20 mg PO daily with food starting on 12/24/13   Burnette Sautter L. Roseanne RenoStewart, PharmD Clinical Pharmacy Resident Pager: [phone] 12/03/2013 7:08 PM

## 2014-01-17 ENCOUNTER — Ambulatory Visit: Payer: BLUE CROSS/BLUE SHIELD | Attending: Orthopaedic Surgery | Admitting: Physical Therapy

## 2014-01-17 DIAGNOSIS — W010XXD Fall on same level from slipping, tripping and stumbling without subsequent striking against object, subsequent encounter: Secondary | ICD-10-CM | POA: Insufficient documentation

## 2014-01-17 DIAGNOSIS — M25562 Pain in left knee: Secondary | ICD-10-CM | POA: Diagnosis not present

## 2014-01-17 DIAGNOSIS — S82001D Unspecified fracture of right patella, subsequent encounter for closed fracture with routine healing: Secondary | ICD-10-CM | POA: Insufficient documentation

## 2014-01-17 DIAGNOSIS — M25561 Pain in right knee: Secondary | ICD-10-CM | POA: Diagnosis not present

## 2014-01-17 DIAGNOSIS — S82002D Unspecified fracture of left patella, subsequent encounter for closed fracture with routine healing: Secondary | ICD-10-CM | POA: Insufficient documentation

## 2014-01-23 ENCOUNTER — Ambulatory Visit: Payer: BLUE CROSS/BLUE SHIELD | Admitting: Physical Therapy

## 2014-01-23 DIAGNOSIS — S82002D Unspecified fracture of left patella, subsequent encounter for closed fracture with routine healing: Secondary | ICD-10-CM | POA: Diagnosis not present

## 2014-01-30 ENCOUNTER — Ambulatory Visit: Payer: BLUE CROSS/BLUE SHIELD | Admitting: Physical Therapy

## 2014-01-30 DIAGNOSIS — S82002D Unspecified fracture of left patella, subsequent encounter for closed fracture with routine healing: Secondary | ICD-10-CM | POA: Diagnosis not present

## 2014-02-05 ENCOUNTER — Ambulatory Visit: Payer: BLUE CROSS/BLUE SHIELD | Attending: Orthopaedic Surgery | Admitting: Physical Therapy

## 2014-02-05 DIAGNOSIS — X58XXXD Exposure to other specified factors, subsequent encounter: Secondary | ICD-10-CM | POA: Insufficient documentation

## 2014-02-05 DIAGNOSIS — S82001D Unspecified fracture of right patella, subsequent encounter for closed fracture with routine healing: Secondary | ICD-10-CM | POA: Insufficient documentation

## 2014-02-05 DIAGNOSIS — M25562 Pain in left knee: Secondary | ICD-10-CM | POA: Diagnosis not present

## 2014-02-05 DIAGNOSIS — S82002D Unspecified fracture of left patella, subsequent encounter for closed fracture with routine healing: Secondary | ICD-10-CM | POA: Insufficient documentation

## 2014-02-05 DIAGNOSIS — M25561 Pain in right knee: Secondary | ICD-10-CM | POA: Insufficient documentation

## 2014-02-20 ENCOUNTER — Ambulatory Visit: Payer: BLUE CROSS/BLUE SHIELD | Admitting: Physical Therapy

## 2014-02-20 ENCOUNTER — Encounter: Payer: Self-pay | Admitting: Physical Therapy

## 2014-02-20 DIAGNOSIS — S82002D Unspecified fracture of left patella, subsequent encounter for closed fracture with routine healing: Secondary | ICD-10-CM | POA: Diagnosis not present

## 2014-02-20 DIAGNOSIS — M25561 Pain in right knee: Secondary | ICD-10-CM

## 2014-02-20 DIAGNOSIS — M25562 Pain in left knee: Principal | ICD-10-CM

## 2014-02-20 NOTE — Therapy (Addendum)
[facility] [address] [place], [place], [address] Phone: [phone]   Fax:  [phone]  Physical Therapy Treatment  Patient Details  Name: Kathleen Underwood MRN: [id number] Date of Birth: [birth year] Referring Provider:  Garald Balding, MD  Encounter Date: 02/20/2014    Past Medical History  Diagnosis Date  . Hypertension     Past Surgical History  Procedure Laterality Date  . Abdominal hysterectomy    . Joint aspiration knee  11/10/2013         There were no vitals taken for this visit.  Visit Diagnosis:  Arthralgia of both knees      AROM of the right knee 3-114 degrees flexion AROM of the left knee 2-130 degrees flexion All Goals met Reviewed all HEP and added to, answered questions regarding from her and her spouse.  PT demo and patient verbalized understanding                          PHYSICAL THERAPY DISCHARGE SUMMARY  Visits from Start of Care: 5   Plan: Patient agrees to discharge.  Patient goals were partially met. Patient is being discharged due to not returning since the last visit.  ?????          Problem List Patient Active Problem List   Diagnosis Date Noted  . Fracture of patella, left, closed 11/10/2013  . Fracture of patella, right, closed 11/10/2013    Sumner Boast, PT 08/14/2014, 8:10 AM  [facility], [place], [address] Phone: [phone]   Fax:  [phone]

## 2015-07-22 IMAGING — CR DG FINGER INDEX 2+V*L*
3 series · 3 of 3 positions shown · non-contrast
Comparison: None.

CLINICAL DATA: Tripped and fell, hand pain

EXAM:
LEFT INDEX FINGER 2+V

[x finger pa left]
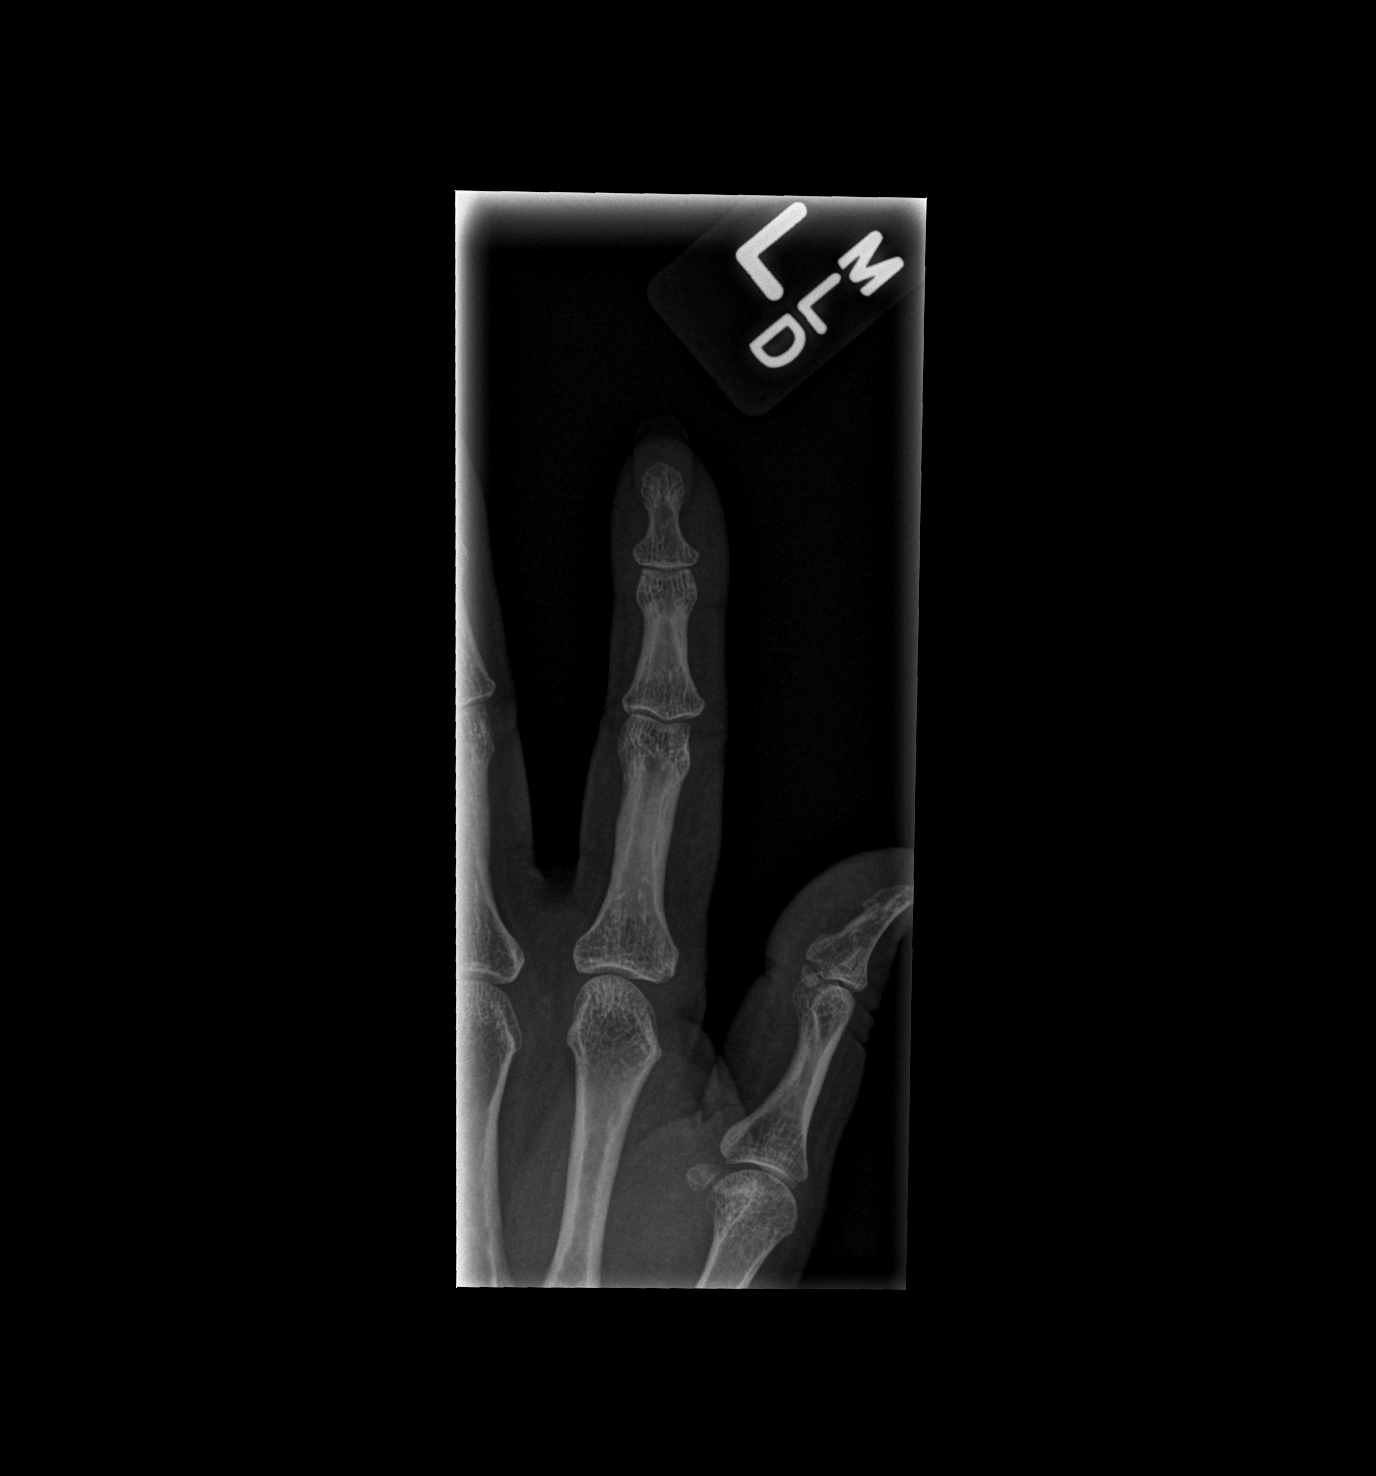

[x finger obl left]
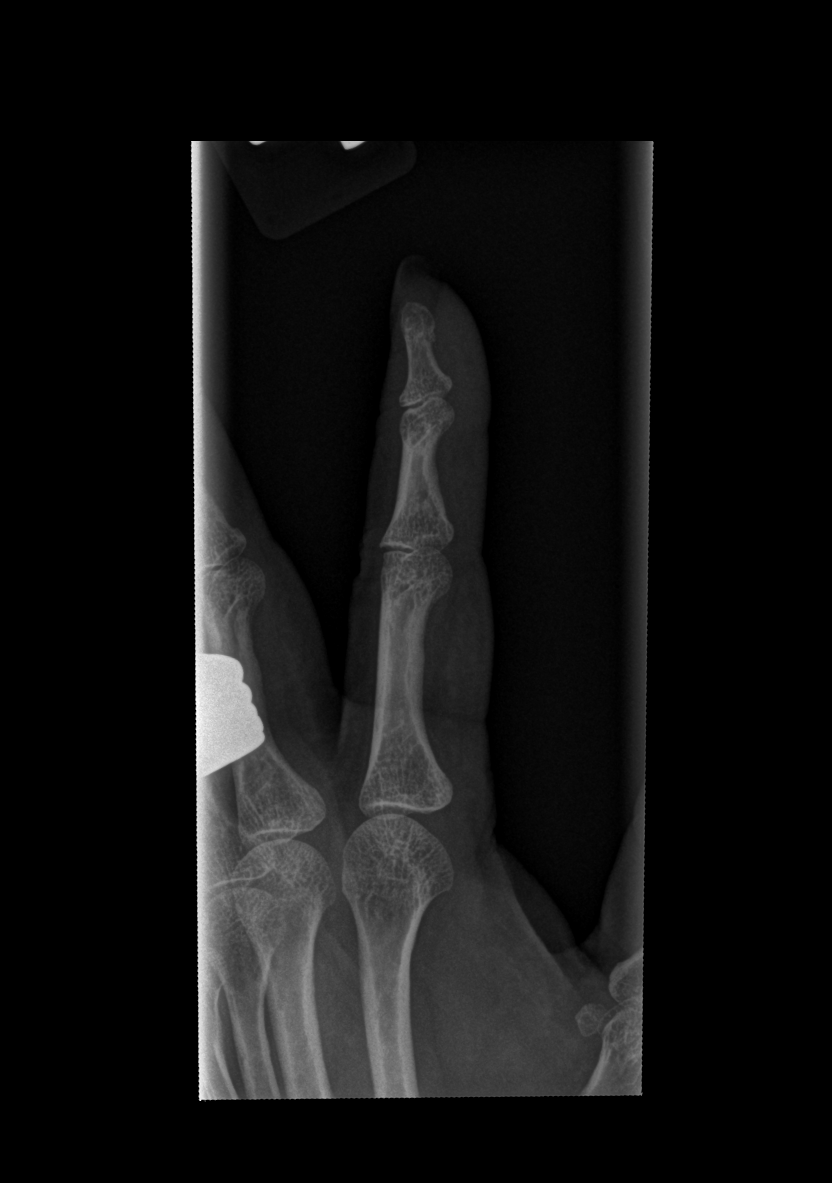

[x finger lat left]
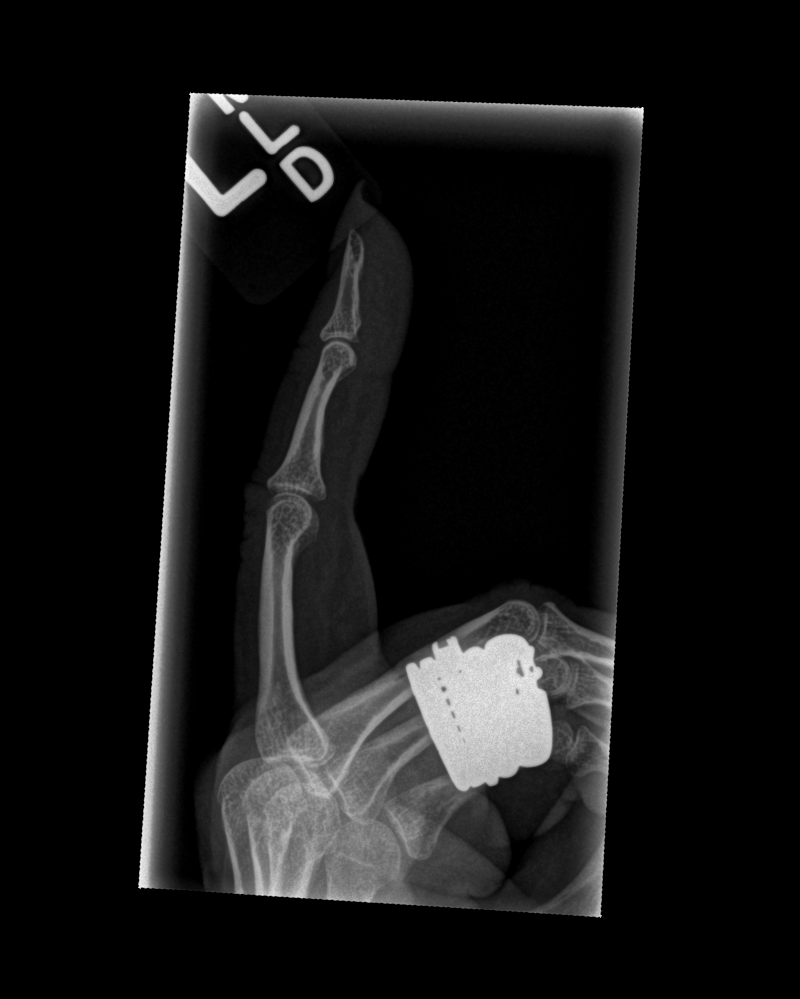

[3 of 3 positions shown; findings below may reference images not displayed]

FINDINGS: There is no evidence of fracture or dislocation. There is no
evidence of arthropathy or other focal bone abnormality. Soft
tissues are unremarkable.
IMPRESSION: Negative.

## 2015-07-22 IMAGING — CR DG KNEE COMPLETE 4+V*L*
4 series · 4 of 4 positions shown · non-contrast
Comparison: Contralateral right knee radiographs same date

CLINICAL DATA: Tripped and fell

EXAM:
LEFT KNEE - COMPLETE 4+ VIEW

[x knee lat left]
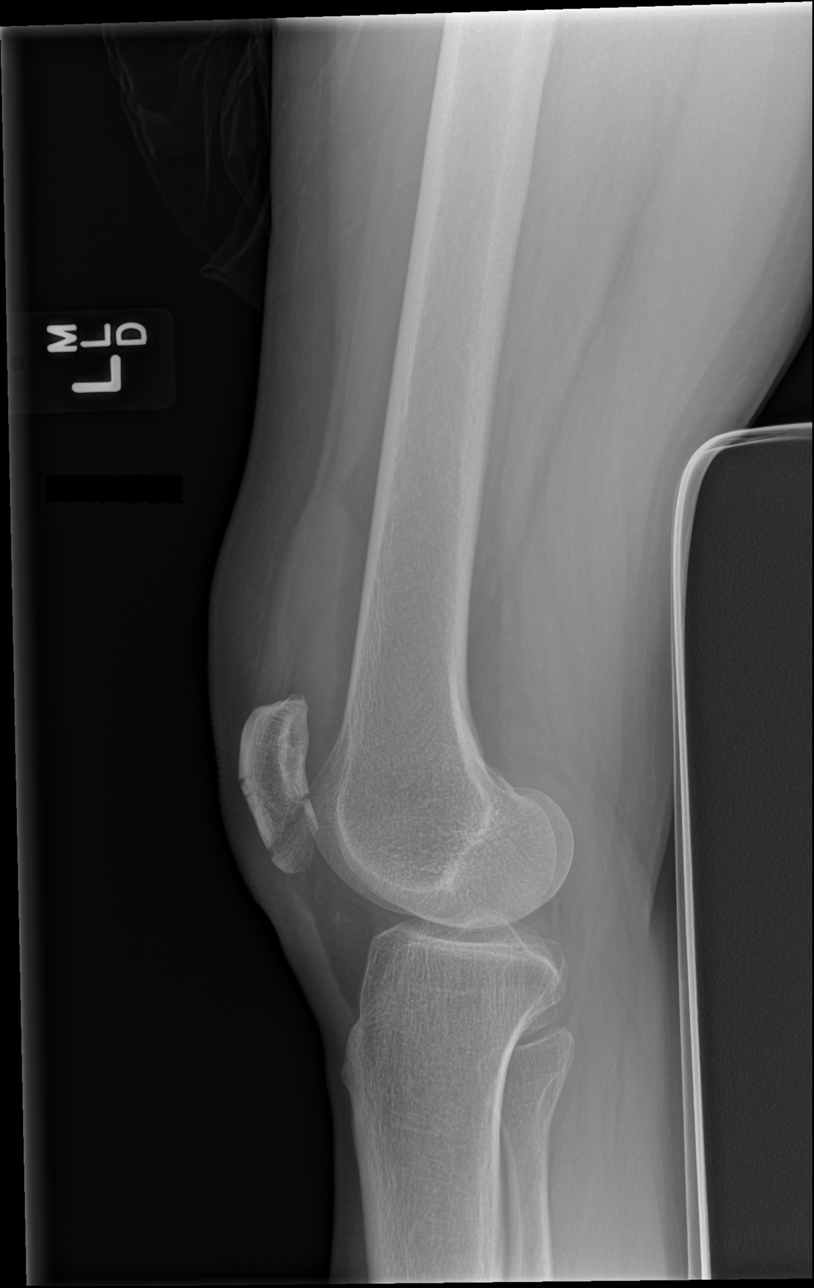

[x knee ap left (1 of 3)]
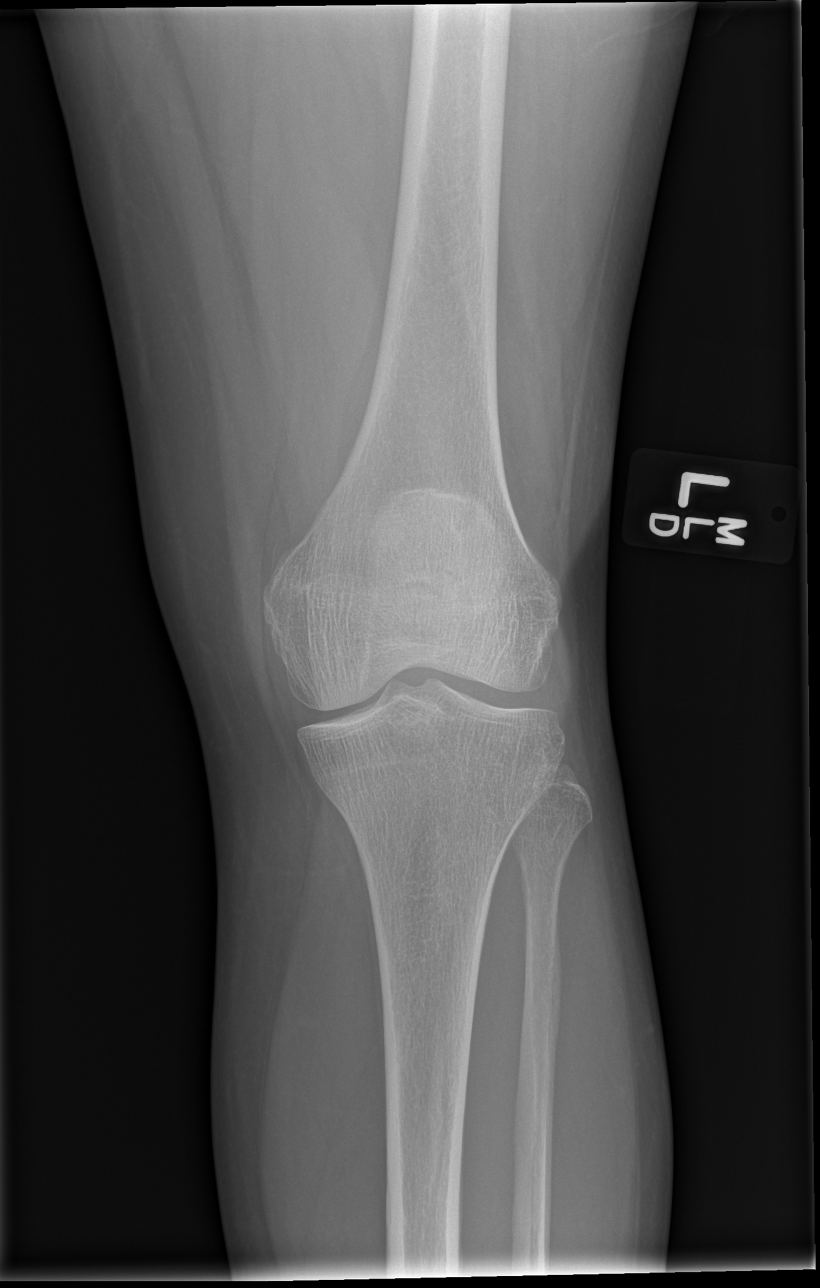

[x knee ap left (2 of 3)]
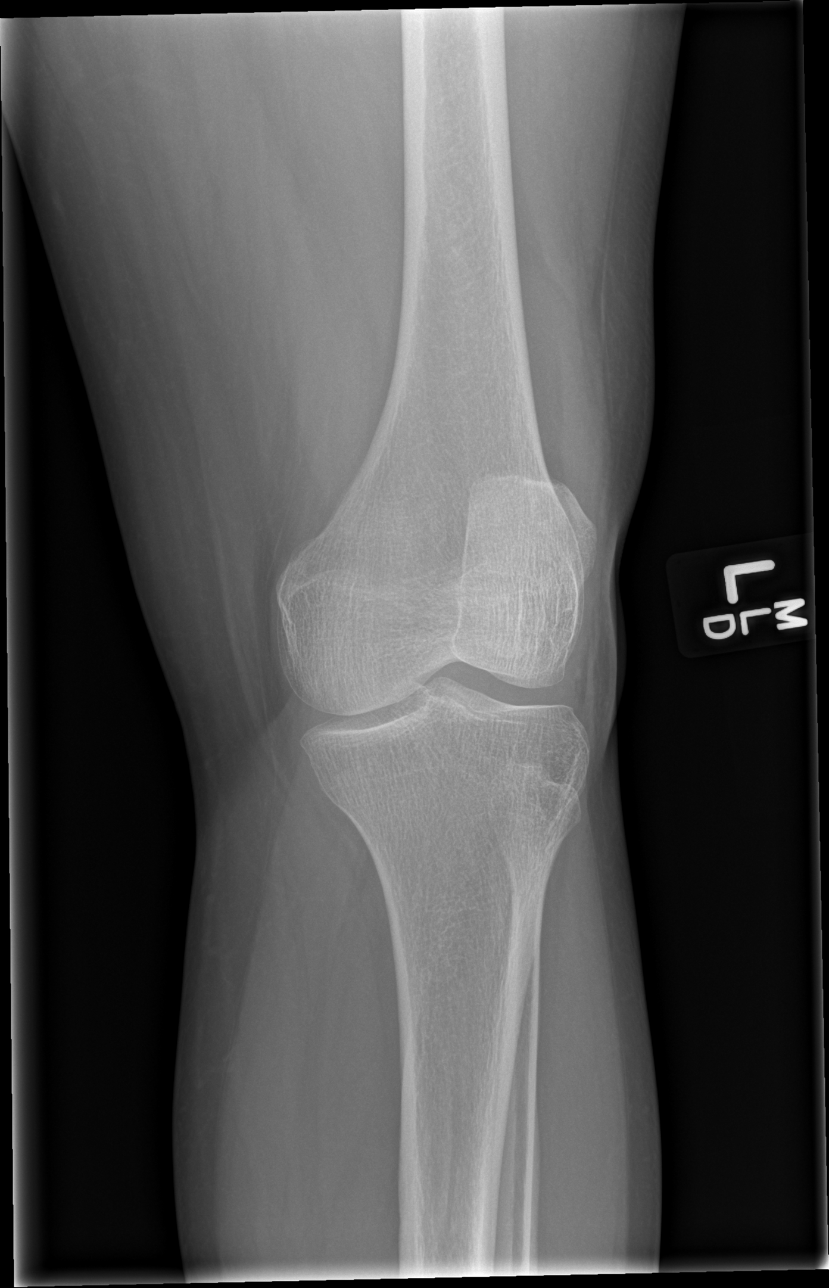

[x knee ap left (3 of 3)]
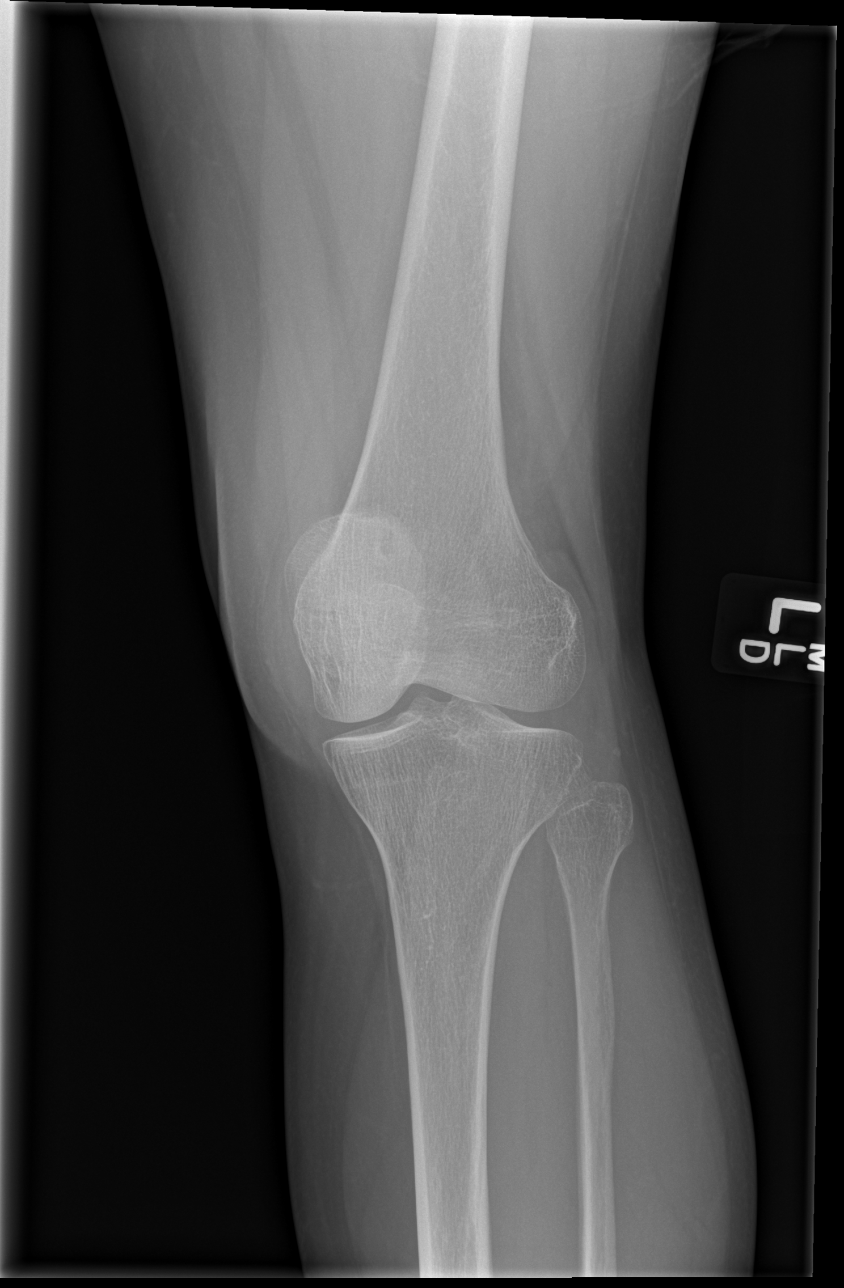

[4 of 4 positions shown; findings below may reference images not displayed]

FINDINGS: There is a mildly comminuted predominantly transverse patellar
fracture with small suprapatellar effusion and prepatellar soft
tissue swelling. No radiopaque foreign body. No dislocation.
IMPRESSION: Predominantly transverse patellar fracture.

## 2015-08-14 IMAGING — US US EXTREM LOW VENOUS*L*
1 series · 13 of 24 positions shown · non-contrast
Comparison: None.

CLINICAL DATA: Acute left calf pain, swelling and tenderness



[Series 1: us extrem low venous*left* · 13 of 38 slices shown]
[im 1/38]
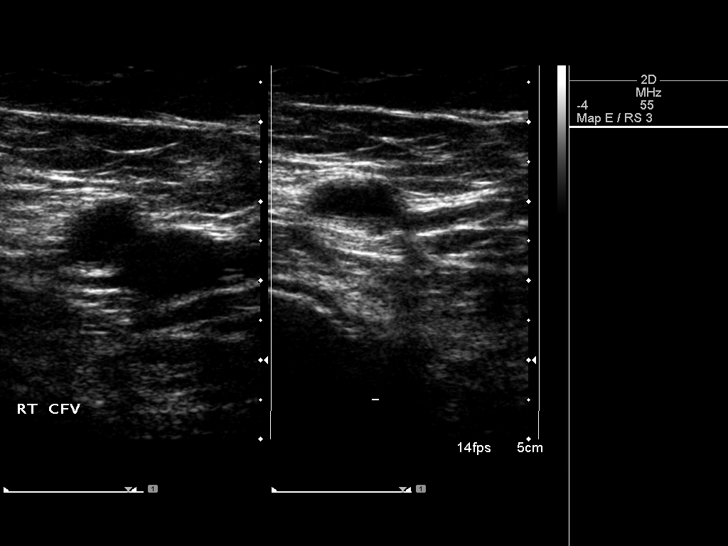
[im 4/38]
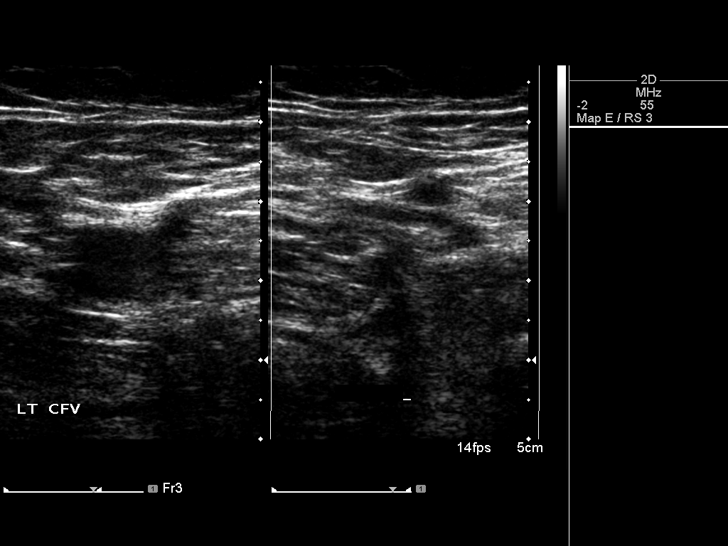
[im 7/38]
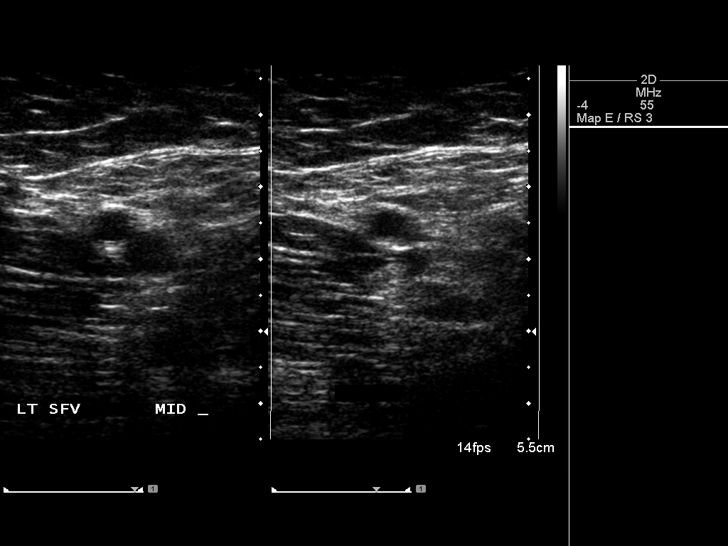
[im 10/38]
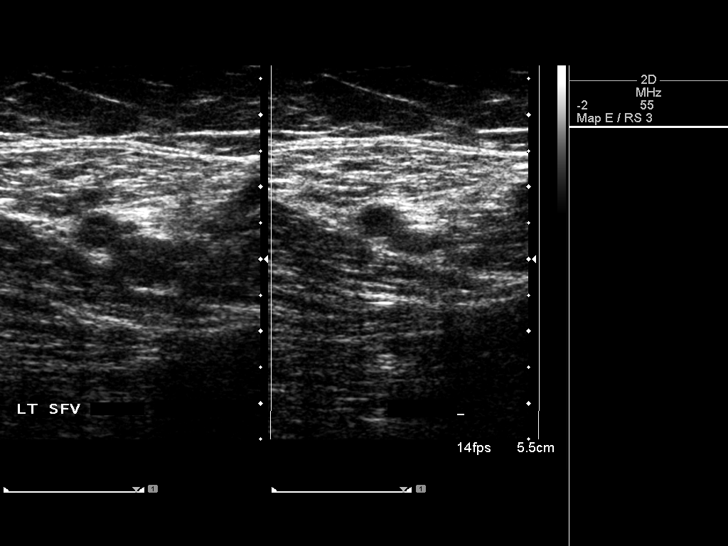
[im 13/38]
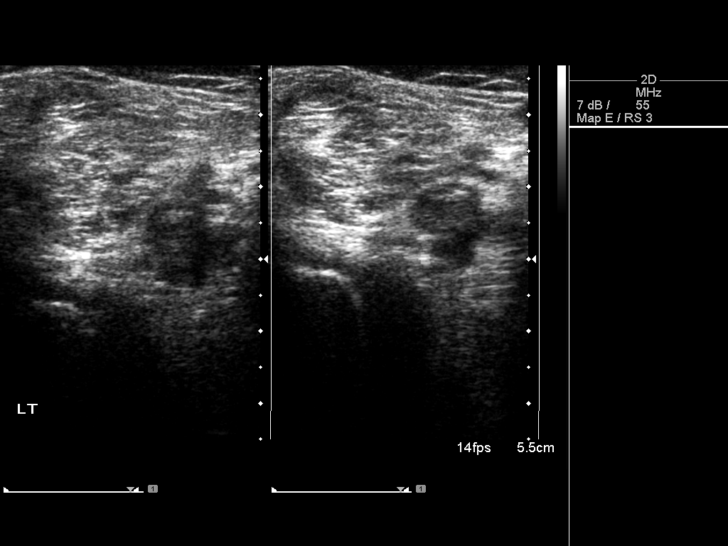
[im 17/38]
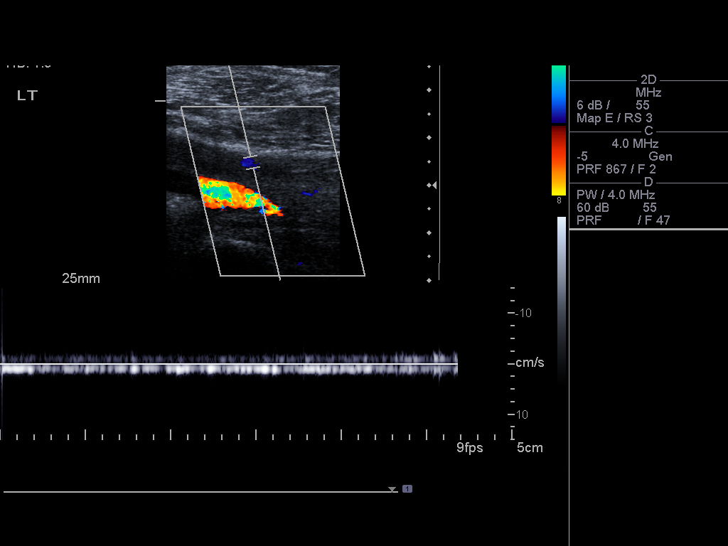
[im 20/38]
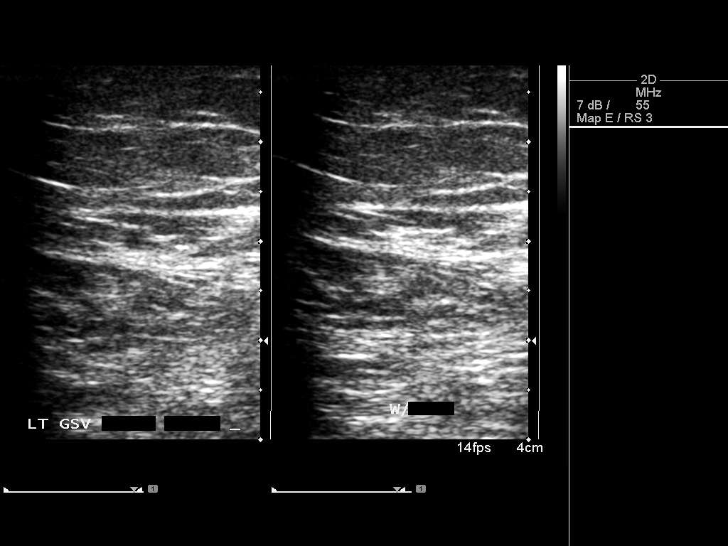
[im 21/38]
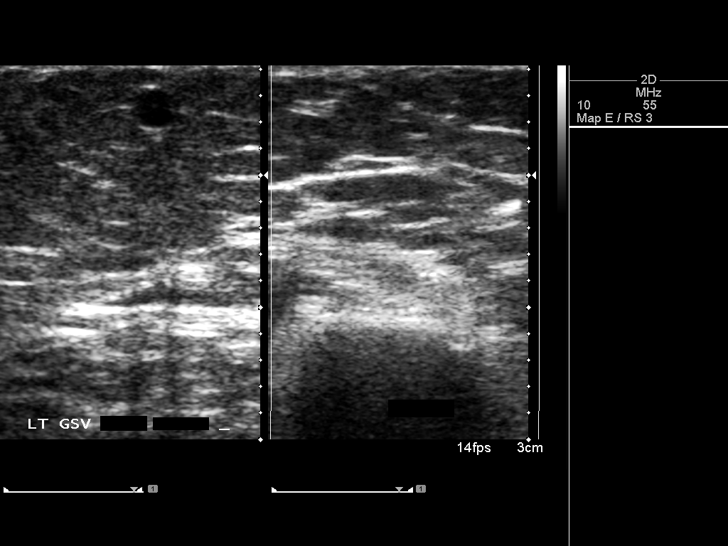
[im 25/38]
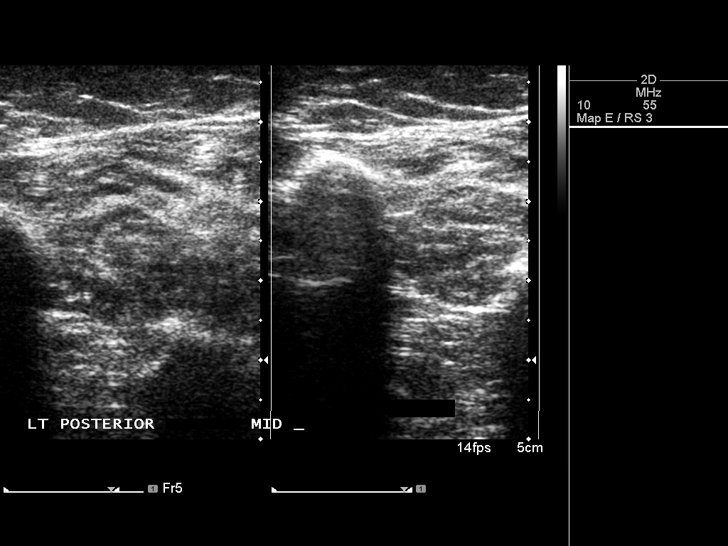
[im 28/38]
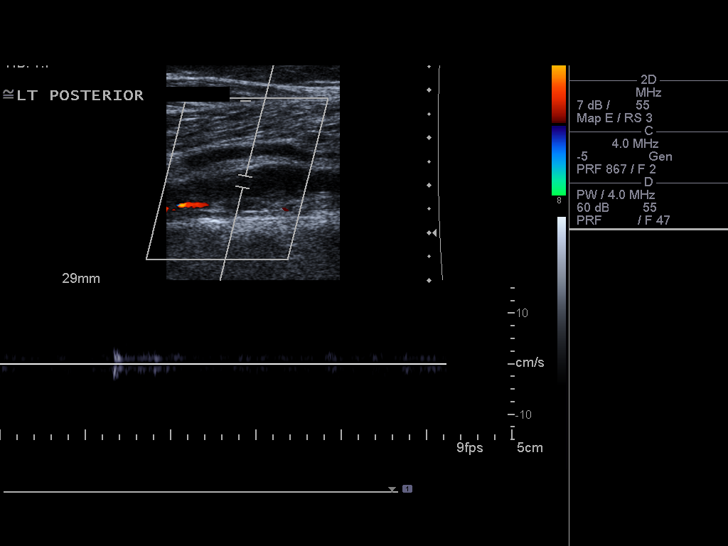
[im 31/38]
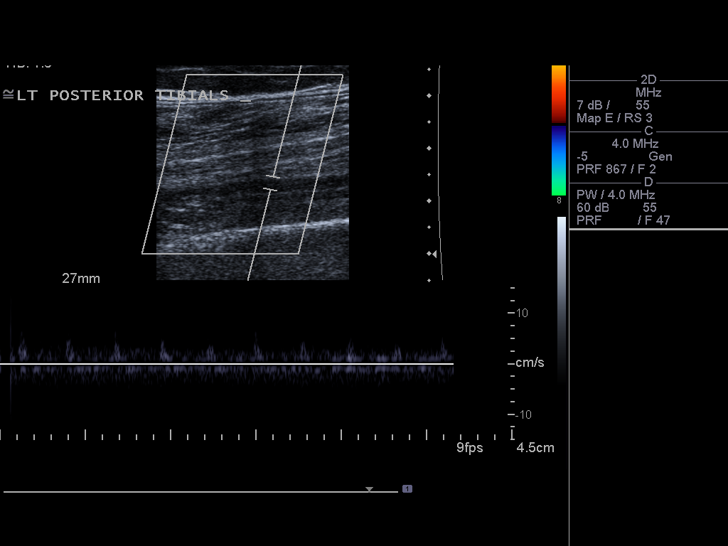
[im 34/38]
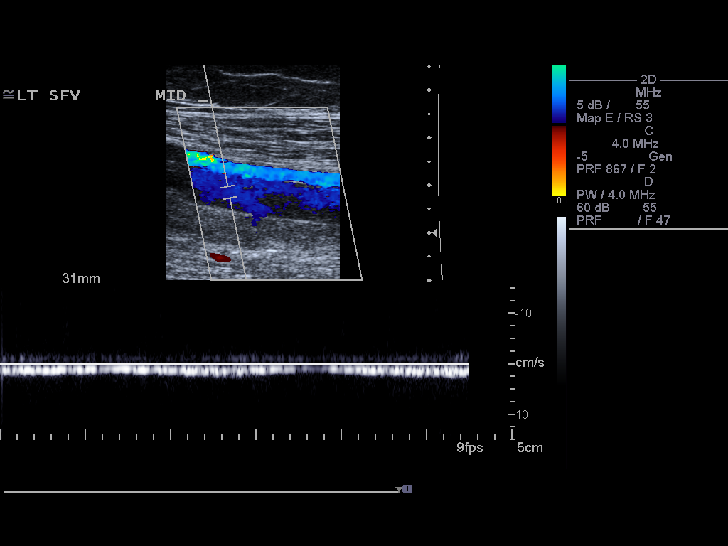
[im 38/38]
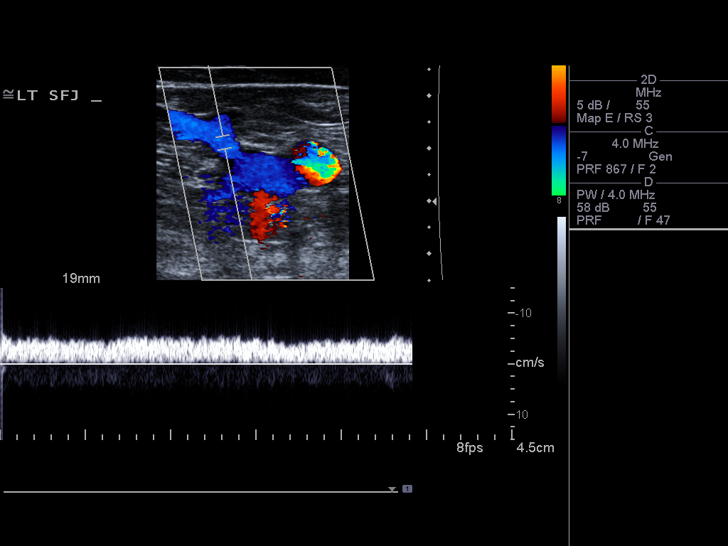

[13 of 24 positions shown; findings below may reference images not displayed]

FINDINGS: Contralateral Common Femoral Vein: Respiratory phasicity is normal
and symmetric with the symptomatic side. No evidence of thrombus.
Normal compressibility.

Common Femoral Vein: No evidence of thrombus. Normal
compressibility, respiratory phasicity and response to augmentation.

Saphenofemoral Junction: No evidence of thrombus. Normal
compressibility and flow on color Doppler imaging.

Profunda Femoral Vein: No evidence of thrombus. Normal
compressibility and flow on color Doppler imaging.

Femoral Vein: Positive for hypoechoic intraluminal thrombus in the
mid to distal superficial femoral vein appearing nonocclusive.
Vessel remains partially compressible. Augmentation not performed.

Popliteal Vein: Hypoechoic intraluminal thrombus appears more
extensive in the popliteal vein and is occlusive. Vessel is
noncompressible. Augmentation not performed.

Calf Veins: Hypoechoic occlusive thrombus extends into the calf
tibial veins. Peroneal vein not well visualized.

Superficial Great Saphenous Vein: No evidence of thrombus. Normal
compressibility and flow on color Doppler imaging.

Venous Reflux:  None.

Other Findings:  None.
IMPRESSION: Positive exam for left lower extremity distal femoral, popliteal and
tibial DVT. Thrombus appears occlusive in the popliteal and tibial
veins.

These results will be called to the ordering clinician or
representative by the [HOSPITAL] at the imaging location.

## 2017-01-31 DIAGNOSIS — Z1211 Encounter for screening for malignant neoplasm of colon: Secondary | ICD-10-CM | POA: Diagnosis not present

## 2017-01-31 DIAGNOSIS — E782 Mixed hyperlipidemia: Secondary | ICD-10-CM | POA: Diagnosis not present

## 2017-01-31 DIAGNOSIS — I10 Essential (primary) hypertension: Secondary | ICD-10-CM | POA: Diagnosis not present

## 2017-01-31 DIAGNOSIS — E039 Hypothyroidism, unspecified: Secondary | ICD-10-CM | POA: Diagnosis not present

## 2017-03-02 DIAGNOSIS — N390 Urinary tract infection, site not specified: Secondary | ICD-10-CM | POA: Diagnosis not present

## 2017-03-02 DIAGNOSIS — R531 Weakness: Secondary | ICD-10-CM | POA: Diagnosis not present

## 2017-03-07 DIAGNOSIS — H6123 Impacted cerumen, bilateral: Secondary | ICD-10-CM | POA: Diagnosis not present

## 2017-03-07 DIAGNOSIS — H60333 Swimmer's ear, bilateral: Secondary | ICD-10-CM | POA: Diagnosis not present

## 2017-04-01 DIAGNOSIS — Z1211 Encounter for screening for malignant neoplasm of colon: Secondary | ICD-10-CM | POA: Diagnosis not present

## 2017-04-01 DIAGNOSIS — D126 Benign neoplasm of colon, unspecified: Secondary | ICD-10-CM | POA: Diagnosis not present

## 2017-04-01 DIAGNOSIS — K635 Polyp of colon: Secondary | ICD-10-CM | POA: Diagnosis not present

## 2017-04-05 DIAGNOSIS — Z1211 Encounter for screening for malignant neoplasm of colon: Secondary | ICD-10-CM | POA: Diagnosis not present

## 2017-04-05 DIAGNOSIS — K635 Polyp of colon: Secondary | ICD-10-CM | POA: Diagnosis not present

## 2017-04-05 DIAGNOSIS — D126 Benign neoplasm of colon, unspecified: Secondary | ICD-10-CM | POA: Diagnosis not present

## 2017-08-30 DIAGNOSIS — E782 Mixed hyperlipidemia: Secondary | ICD-10-CM | POA: Diagnosis not present

## 2017-08-30 DIAGNOSIS — E039 Hypothyroidism, unspecified: Secondary | ICD-10-CM | POA: Diagnosis not present

## 2017-08-30 DIAGNOSIS — K219 Gastro-esophageal reflux disease without esophagitis: Secondary | ICD-10-CM | POA: Diagnosis not present

## 2017-08-30 DIAGNOSIS — I10 Essential (primary) hypertension: Secondary | ICD-10-CM | POA: Diagnosis not present

## 2018-02-14 DIAGNOSIS — J0100 Acute maxillary sinusitis, unspecified: Secondary | ICD-10-CM | POA: Diagnosis not present

## 2018-02-14 DIAGNOSIS — J209 Acute bronchitis, unspecified: Secondary | ICD-10-CM | POA: Diagnosis not present

## 2018-02-14 DIAGNOSIS — J111 Influenza due to unidentified influenza virus with other respiratory manifestations: Secondary | ICD-10-CM | POA: Diagnosis not present

## 2018-03-14 DIAGNOSIS — E039 Hypothyroidism, unspecified: Secondary | ICD-10-CM | POA: Diagnosis not present

## 2018-03-14 DIAGNOSIS — E782 Mixed hyperlipidemia: Secondary | ICD-10-CM | POA: Diagnosis not present

## 2018-03-14 DIAGNOSIS — K219 Gastro-esophageal reflux disease without esophagitis: Secondary | ICD-10-CM | POA: Diagnosis not present

## 2018-03-14 DIAGNOSIS — I10 Essential (primary) hypertension: Secondary | ICD-10-CM | POA: Diagnosis not present

## 2018-08-09 DIAGNOSIS — Z1231 Encounter for screening mammogram for malignant neoplasm of breast: Secondary | ICD-10-CM | POA: Diagnosis not present

## 2018-09-07 DIAGNOSIS — E039 Hypothyroidism, unspecified: Secondary | ICD-10-CM | POA: Diagnosis not present

## 2018-09-07 DIAGNOSIS — E782 Mixed hyperlipidemia: Secondary | ICD-10-CM | POA: Diagnosis not present

## 2018-12-14 DIAGNOSIS — L918 Other hypertrophic disorders of the skin: Secondary | ICD-10-CM | POA: Diagnosis not present

## 2018-12-14 DIAGNOSIS — L820 Inflamed seborrheic keratosis: Secondary | ICD-10-CM | POA: Diagnosis not present

## 2018-12-14 DIAGNOSIS — R208 Other disturbances of skin sensation: Secondary | ICD-10-CM | POA: Diagnosis not present

## 2018-12-14 DIAGNOSIS — L821 Other seborrheic keratosis: Secondary | ICD-10-CM | POA: Diagnosis not present

## 2021-04-14 ENCOUNTER — Encounter: Payer: Self-pay | Admitting: Nurse Practitioner

## 2021-04-14 ENCOUNTER — Ambulatory Visit (INDEPENDENT_AMBULATORY_CARE_PROVIDER_SITE_OTHER): Payer: No Typology Code available for payment source | Admitting: Nurse Practitioner

## 2021-04-14 VITALS — BP 122/82 | HR 74 | Temp 97.6°F | Ht 59.45 in | Wt 136.1 lb

## 2021-04-14 DIAGNOSIS — I10 Essential (primary) hypertension: Secondary | ICD-10-CM | POA: Diagnosis not present

## 2021-04-14 DIAGNOSIS — Z8669 Personal history of other diseases of the nervous system and sense organs: Secondary | ICD-10-CM | POA: Diagnosis not present

## 2021-04-14 DIAGNOSIS — E039 Hypothyroidism, unspecified: Secondary | ICD-10-CM

## 2021-04-14 DIAGNOSIS — Z6827 Body mass index (BMI) 27.0-27.9, adult: Secondary | ICD-10-CM | POA: Diagnosis not present

## 2021-04-14 DIAGNOSIS — Z7689 Persons encountering health services in other specified circumstances: Secondary | ICD-10-CM

## 2021-04-14 NOTE — Progress Notes (Signed)
? ?New Patient Office Visit ? ?Subjective:  ?Patient ID: Kathleen Underwood, female    DOB: 23-Mar-1960  Age: 61 y.o. MRN: [id number] ? ?CC:  ?Chief Complaint  ?Patient presents with  ? New Patient (Initial Visit)  ? ? ?HPI ?Kathleen Underwood presents to establish new primary care provider. Relocating from different local primary care provider.  ?-on3/14/2023 - had ear drum rupture, diagnosed per Urgent Care. Followed up with her previous PCP. Was given new prescription for ear drops. Ear pain has improved. She states that she is still unable to hear well out of it. Sh ?e was told to give it a little time prior to referral to ENT. She did finish her ear drops abot 10 days ago.  ?-well managed hypertension.  ?-due to have CPE and pap smear  ? ?Past Medical History:  ?Diagnosis Date  ? Hypertension   ? ? ?Past Surgical History:  ?Procedure Laterality Date  ? ABDOMINAL HYSTERECTOMY    ? JOINT ASPIRATION KNEE  11/10/2013  ?    ? ? ?Family History  ?Problem Relation Age of Onset  ? High blood pressure Mother   ? High blood pressure Father   ? ? ?Social History  ? ?Socioeconomic History  ? Marital status: Married  ?  Spouse name: Not on file  ? Number of children: Not on file  ? Years of education: Not on file  ? Highest education level: Not on file  ?Occupational History  ? Not on file  ?Tobacco Use  ? Smoking status: Never  ? Smokeless tobacco: Not on file  ?Substance and Sexual Activity  ? Alcohol use: No  ? Drug use: No  ? Sexual activity: Not on file  ?Other Topics Concern  ? Not on file  ?Social History Narrative  ? Not on file  ? ?Social Determinants of Health  ? ?Financial Resource Strain: Not on file  ?Food Insecurity: Not on file  ?Transportation Needs: Not on file  ?Physical Activity: Not on file  ?Stress: Not on file  ?Social Connections: Not on file  ?Intimate Partner Violence: Not on file  ? ? ?ROS ?Review of Systems  ?Constitutional:  Negative for activity change, appetite change, chills, fatigue and fever.   ?HENT:  Positive for hearing loss. Negative for congestion, postnasal drip, rhinorrhea, sinus pressure, sinus pain, sneezing and sore throat.   ?     Persistent decreased hearing out of left ear since eardrum perforation.  ?Eyes: Negative.   ?Respiratory:  Negative for cough, chest tightness, shortness of breath and wheezing.   ?Cardiovascular:  Negative for chest pain and palpitations.  ?Gastrointestinal:  Negative for abdominal pain, constipation, diarrhea, nausea and vomiting.  ?Endocrine: Negative for cold intolerance, heat intolerance, polydipsia and polyuria.  ?Genitourinary:  Negative for dyspareunia, dysuria, flank pain, frequency and urgency.  ?Musculoskeletal:  Negative for arthralgias, back pain and myalgias.  ?Skin:  Negative for rash.  ?Allergic/Immunologic: Negative for environmental allergies.  ?Neurological:  Negative for dizziness, weakness and headaches.  ?Hematological:  Negative for adenopathy.  ?Psychiatric/Behavioral:  The patient is not nervous/anxious.   ? ?Objective:  ? ?Today's Vitals  ? 04/14/21 1527  ?BP: 122/82  ?Pulse: 74  ?Temp: 97.6 ?F (36.4 ?C)  ?SpO2: 99%  ?Weight: 136 lb 1.9 oz (61.7 kg)  ?Height: 4' 11.45" (1.51 m)  ? ?Body mass index is 27.08 kg/m?.  ? ?Physical Exam ?Vitals and nursing note reviewed.  ?Constitutional:   ?   Appearance: Normal appearance. She is well-developed.  ?  HENT:  ?   Head: Normocephalic and atraumatic.  ?   Right Ear: Hearing, tympanic membrane, ear canal and external ear normal.  ?   Left Ear: Ear canal and external ear normal. Decreased hearing noted. Tympanic membrane is bulging.  ?   Nose: Nose normal.  ?   Mouth/Throat:  ?   Mouth: Mucous membranes are moist.  ?   Pharynx: Oropharynx is clear.  ?Eyes:  ?   Extraocular Movements: Extraocular movements intact.  ?   Conjunctiva/sclera: Conjunctivae normal.  ?   Pupils: Pupils are equal, round, and reactive to light.  ?Cardiovascular:  ?   Rate and Rhythm: Normal rate and regular rhythm.  ?   Pulses:  Normal pulses.  ?   Heart sounds: Normal heart sounds.  ?Pulmonary:  ?   Effort: Pulmonary effort is normal.  ?   Breath sounds: Normal breath sounds.  ?Abdominal:  ?   Palpations: Abdomen is soft.  ?Musculoskeletal:     ?   General: Normal range of motion.  ?   Cervical back: Normal range of motion and neck supple.  ?Lymphadenopathy:  ?   Cervical: No cervical adenopathy.  ?Skin: ?   General: Skin is warm and dry.  ?   Capillary Refill: Capillary refill takes less than 2 seconds.  ?Neurological:  ?   General: No focal deficit present.  ?   Mental Status: She is alert and oriented to person, place, and time.  ?Psychiatric:     ?   Mood and Affect: Mood normal.     ?   Behavior: Behavior normal.     ?   Thought Content: Thought content normal.     ?   Judgment: Judgment normal.  ? ? ?Assessment & Plan:  ?1. Essential hypertension ?Blood pressure stable.  Continue medications as prescribed. ? ?2. History of perforated ear drum ?Patient with recent perforation of left lung 03/17/2021.  Hearing is still diminished from left ear.  Inner ear canal appears swollen but without evidence of infection or other abnormalities.  We will continue to monitor. ? ?3. Body mass index 27.0-27.9, adult ?Discussed lowering calorie intake to 1500 calories per day and incorporating exercise into daily routine to help lose weight.  ? ?4. Acquired hypothyroidism ?Check thyroid panel prior to next visit and adjust levothyroxine dose as indicated. ? ?5. Encounter to establish care ?Appointment today to establish new primary care provider.  We will get medical records from previous primary care provider to review and update patient chart. ? ? ?Problem List Items Addressed This Visit   ? ?  ? Cardiovascular and Mediastinum  ? Essential hypertension - Primary  ?  ? Endocrine  ? Acquired hypothyroidism  ?  ? Other  ? History of perforated ear drum  ? Body mass index 27.0-27.9, adult  ? ?Other Visit Diagnoses   ? ? Encounter to establish care       ? ?  ? ? ?Outpatient Encounter Medications as of 04/14/2021  ?Medication Sig  ? benazepril-hydrochlorthiazide (LOTENSIN HCT) 20-12.5 MG per tablet Take 1 tablet by mouth daily.  ? levothyroxine (SYNTHROID, LEVOTHROID) 50 MCG tablet Take 50 mcg by mouth daily before breakfast.  ? famotidine (PEPCID) 20 MG tablet 1 tablet as needed at bedtime  ? [DISCONTINUED] acetaminophen (TYLENOL) 325 MG tablet Take 650 mg by mouth every 6 (six) hours as needed for mild pain or moderate pain.  ? [DISCONTINUED] levothyroxine (SYNTHROID) 75 MCG tablet 1 tablet in the morning  on an empty stomach  ? [DISCONTINUED] oxyCODONE-acetaminophen (PERCOCET/ROXICET) 5-325 MG per tablet Take 1-2 tablets by mouth every 6 (six) hours as needed for severe pain.  ? [DISCONTINUED] Rivaroxaban (XARELTO) 15 MG TABS tablet Take 1 tablet (15 mg total) by mouth 2 (two) times daily.  ? ?No facility-administered encounter medications on file as of 04/14/2021.  ? ? ?Follow-up: Return in about 2 months (around 06/14/2021), or CPE with pap smear, for need to get records from [facility] .  ? ?Carlean JewsHeather E Ryah Cribb, NP ? ?

## 2021-04-19 DIAGNOSIS — Z6827 Body mass index (BMI) 27.0-27.9, adult: Secondary | ICD-10-CM | POA: Insufficient documentation

## 2021-04-19 DIAGNOSIS — Z8669 Personal history of other diseases of the nervous system and sense organs: Secondary | ICD-10-CM | POA: Insufficient documentation

## 2021-04-19 DIAGNOSIS — E039 Hypothyroidism, unspecified: Secondary | ICD-10-CM | POA: Insufficient documentation

## 2021-04-19 DIAGNOSIS — I10 Essential (primary) hypertension: Secondary | ICD-10-CM | POA: Insufficient documentation

## 2021-04-28 ENCOUNTER — Encounter: Payer: Self-pay | Admitting: Nurse Practitioner

## 2021-06-15 ENCOUNTER — Encounter: Payer: No Typology Code available for payment source | Admitting: Nurse Practitioner

## 2021-07-07 NOTE — Progress Notes (Unsigned)
Complete physical exam   Patient: Kathleen Underwood   DOB: 09-28-1960   61 y.o. Female  MRN: [id number] Visit Date: 07/08/2021    No chief complaint on file.  Subjective    Kathleen Underwood is a 61 y.o. female who presents today for a complete physical exam.  She reports consuming a {diet types:17450} diet. {Exercise:19826} She generally feels {well/fairly well/poorly:18703}. She {does/does not:200015} have additional problems to discuss today.   HPI  Annual physical with pap smear.  -should have routine, fasting labs  -historically well controlled hypertension  -hypothyroid - on levothyroxine 50 mcg daily  -due for screening mammogram 08/21/2021   Past Medical History:  Diagnosis Date   Hypertension    Past Surgical History:  Procedure Laterality Date   ABDOMINAL HYSTERECTOMY     JOINT ASPIRATION KNEE  11/10/2013       Social History   Socioeconomic History   Marital status: Married    Spouse name: Not on file   Number of children: Not on file   Years of education: Not on file   Highest education level: Not on file  Occupational History   Not on file  Tobacco Use   Smoking status: Never   Smokeless tobacco: Not on file  Substance and Sexual Activity   Alcohol use: No   Drug use: No   Sexual activity: Not on file  Other Topics Concern   Not on file  Social History Narrative   Not on file   Social Determinants of Health   Financial Resource Strain: Not on file  Food Insecurity: Not on file  Transportation Needs: Not on file  Physical Activity: Not on file  Stress: Not on file  Social Connections: Not on file  Intimate Partner Violence: Not on file   Family Status  Relation Name Status   Mother  (Not Specified)   Father  (Not Specified)   Family History  Problem Relation Age of Onset   High blood pressure Mother    High blood pressure Father    Allergies  Allergen Reactions   Covid-19 (Mrna) Vaccine Rash    Patient Care Team: Carlean Jews,  NP as PCP - General (Family Medicine)   Medications: Outpatient Medications Prior to Visit  Medication Sig   benazepril-hydrochlorthiazide (LOTENSIN HCT) 20-12.5 MG per tablet Take 1 tablet by mouth daily.   famotidine (PEPCID) 20 MG tablet 1 tablet as needed at bedtime   levothyroxine (SYNTHROID, LEVOTHROID) 50 MCG tablet Take 50 mcg by mouth daily before breakfast.   No facility-administered medications prior to visit.    Review of Systems  {Labs (Optional):23779}   Objective    There were no vitals taken for this visit. BP Readings from Last 3 Encounters:  04/14/21 122/82  12/03/13 113/70  11/10/13 123/78    Wt Readings from Last 3 Encounters:  04/14/21 136 lb 1.9 oz (61.7 kg)  11/10/13 130 lb (59 kg)     Physical Exam  ***  Last depression screening scores    04/14/2021    3:35 PM  PHQ 2/9 Scores  PHQ - 2 Score 0  PHQ- 9 Score 0   Last fall risk screening    04/14/2021    3:35 PM  Fall Risk   Falls in the past year? 0  Number falls in past yr: 0  Injury with Fall? 0  Follow up Falls evaluation completed   Last Audit-C alcohol use screening     No data to display  A score of 3 or more in women, and 4 or more in men indicates increased risk for alcohol abuse, EXCEPT if all of the points are from question 1   No results found for any visits on 07/08/21.  Assessment & Plan    Routine Health Maintenance and Physical Exam  Exercise Activities and Dietary recommendations  Goals   None     Immunization History  Administered Date(s) Administered   Influenza,inj,Quad PF,6+ Mos 10/29/2020   PFIZER(Purple Top)SARS-COV-2 Vaccination 04/03/2019, 04/24/2019   Tdap 07/27/2016    Health Maintenance  Topic Date Due   HIV Screening  Never done   Hepatitis C Screening  Never done   PAP SMEAR-Modifier  Never done   COLONOSCOPY (Pts 45-57yrs Insurance coverage will need to be confirmed)  Never done   Zoster Vaccines- Shingrix (1 of 2) Never done    COVID-19 Vaccine (3 - Pfizer series) 06/19/2019   INFLUENZA VACCINE  08/04/2021   MAMMOGRAM  08/22/2022   TETANUS/TDAP  07/28/2026   HPV VACCINES  Aged Out    Discussed health benefits of physical activity, and encouraged her to engage in regular exercise appropriate for her age and condition.  Problem List Items Addressed This Visit   None    No follow-ups on file.        Carlean Jews, NP  [facility] at [facility] [phone] (phone) [phone] (fax)  [facility]

## 2021-07-08 ENCOUNTER — Other Ambulatory Visit (HOSPITAL_COMMUNITY)
Admission: RE | Admit: 2021-07-08 | Discharge: 2021-07-08 | Disposition: A | Discharge status: Home / Self Care | Payer: No Typology Code available for payment source | Source: Ambulatory Visit | Attending: Nurse Practitioner | Admitting: Nurse Practitioner

## 2021-07-08 ENCOUNTER — Encounter: Payer: Self-pay | Admitting: Nurse Practitioner

## 2021-07-08 ENCOUNTER — Ambulatory Visit (INDEPENDENT_AMBULATORY_CARE_PROVIDER_SITE_OTHER): Payer: No Typology Code available for payment source | Admitting: Nurse Practitioner

## 2021-07-08 VITALS — BP 109/74 | HR 72 | Ht 59.45 in | Wt 136.1 lb

## 2021-07-08 DIAGNOSIS — Z01419 Encounter for gynecological examination (general) (routine) without abnormal findings: Secondary | ICD-10-CM

## 2021-07-08 DIAGNOSIS — Z0000 Encounter for general adult medical examination without abnormal findings: Secondary | ICD-10-CM

## 2021-07-08 DIAGNOSIS — I10 Essential (primary) hypertension: Secondary | ICD-10-CM | POA: Diagnosis not present

## 2021-07-08 DIAGNOSIS — E559 Vitamin D deficiency, unspecified: Secondary | ICD-10-CM | POA: Diagnosis not present

## 2021-07-08 DIAGNOSIS — Z6827 Body mass index (BMI) 27.0-27.9, adult: Secondary | ICD-10-CM

## 2021-07-08 DIAGNOSIS — E039 Hypothyroidism, unspecified: Secondary | ICD-10-CM

## 2021-07-08 DIAGNOSIS — Z1231 Encounter for screening mammogram for malignant neoplasm of breast: Secondary | ICD-10-CM

## 2021-07-08 MED ORDER — SYNTHROID 75 MCG PO TABS: 75.0000 ug | ORAL_TABLET | Freq: Every day | ORAL | 1 refills | Status: DC

## 2021-07-08 MED ORDER — BENAZEPRIL-HYDROCHLOROTHIAZIDE 20-12.5 MG PO TABS: 1.0000 | ORAL_TABLET | Freq: Every day | ORAL | 1 refills | Status: DC

## 2021-07-09 LAB — LIPID PANEL
Chol/HDL Ratio: 3.6 ratio (ref 0.0–4.4)
Cholesterol, Total: 200 mg/dL — ABNORMAL HIGH (ref 100–199)
HDL: 56 mg/dL (ref >39)
LDL Chol Calc (NIH): 122 mg/dL — ABNORMAL HIGH (ref 0–99)
Triglycerides: 123 mg/dL (ref 0–149)
VLDL Cholesterol Cal: 22 mg/dL (ref 5–40)

## 2021-07-09 LAB — COMPREHENSIVE METABOLIC PANEL
ALT: 18 IU/L (ref 0–32)
AST: 21 IU/L (ref 0–40)
Albumin/Globulin Ratio: 1.8 (ref 1.2–2.2)
Albumin: 4.5 g/dL (ref 3.8–4.8)
Alkaline Phosphatase: 86 IU/L (ref 44–121)
BUN/Creatinine Ratio: 20 (ref 12–28)
BUN: 18 mg/dL (ref 8–27)
Bilirubin Total: 0.6 mg/dL (ref 0.0–1.2)
CO2: 24 mmol/L (ref 20–29)
Calcium: 10.1 mg/dL (ref 8.7–10.3)
Chloride: 100 mmol/L (ref 96–106)
Creatinine, Ser: 0.91 mg/dL (ref 0.57–1.00)
Globulin, Total: 2.5 g/dL (ref 1.5–4.5)
Glucose: 92 mg/dL (ref 70–99)
Potassium: 4.1 mmol/L (ref 3.5–5.2)
Sodium: 137 mmol/L (ref 134–144)
Total Protein: 7 g/dL (ref 6.0–8.5)
eGFR: 71 mL/min/{1.73_m2} (ref >59)

## 2021-07-09 LAB — CBC
Hematocrit: 41.6 % (ref 34.0–46.6)
Hemoglobin: 14.7 g/dL (ref 11.1–15.9)
MCH: 32.2 pg (ref 26.6–33.0)
MCHC: 35.3 g/dL (ref 31.5–35.7)
MCV: 91 fL (ref 79–97)
Platelets: 212 10*3/uL (ref 150–450)
RBC: 4.57 x10E6/uL (ref 3.77–5.28)
RDW: 12.5 % (ref 11.7–15.4)
WBC: 6.4 10*3/uL (ref 3.4–10.8)

## 2021-07-09 LAB — HEMOGLOBIN A1C
Est. average glucose Bld gHb Est-mCnc: 108 mg/dL
Hgb A1c MFr Bld: 5.4 % (ref 4.8–5.6)

## 2021-07-09 LAB — VITAMIN D 25 HYDROXY (VIT D DEFICIENCY, FRACTURES): Vit D, 25-Hydroxy: 28.3 ng/mL — ABNORMAL LOW (ref 30.0–100.0)

## 2021-07-09 LAB — CYTOLOGY - PAP
Comment: NEGATIVE
Diagnosis: NEGATIVE
High risk HPV: NEGATIVE

## 2021-07-09 LAB — TSH+FREE T4
Free T4: 1.5 ng/dL (ref 0.82–1.77)
TSH: 2.92 u[IU]/mL (ref 0.450–4.500)

## 2021-07-27 ENCOUNTER — Other Ambulatory Visit: Payer: Self-pay

## 2021-07-27 DIAGNOSIS — I10 Essential (primary) hypertension: Secondary | ICD-10-CM

## 2021-07-27 DIAGNOSIS — E039 Hypothyroidism, unspecified: Secondary | ICD-10-CM

## 2021-07-27 MED ORDER — SYNTHROID 75 MCG PO TABS: 75.0000 ug | ORAL_TABLET | Freq: Every day | ORAL | 1 refills | Status: DC

## 2021-07-27 MED ORDER — BENAZEPRIL-HYDROCHLOROTHIAZIDE 20-12.5 MG PO TABS: 1.0000 | ORAL_TABLET | Freq: Every day | ORAL | 1 refills | Status: DC

## 2021-08-17 NOTE — Progress Notes (Signed)
Please let the patient know that labs and pap are both back. Bad and total cholesterol are very slightly elevated. I recommend she limit intake of fried and fatty foods. she should increase intake of lean proteins and green leafy vegetables. Adding exercise into daily routine will also be beneficial.  Her vitamin d was a little bit low. I advise she take over the counter vitamin d 2000 iu daily.  All other labs wee normal.  Pap was normal. This should be repeated in three years.  Thanks so much.   -HB

## 2021-08-18 ENCOUNTER — Other Ambulatory Visit: Payer: Self-pay

## 2021-08-18 DIAGNOSIS — I10 Essential (primary) hypertension: Secondary | ICD-10-CM

## 2021-08-18 DIAGNOSIS — E039 Hypothyroidism, unspecified: Secondary | ICD-10-CM

## 2021-08-18 MED ORDER — BENAZEPRIL-HYDROCHLOROTHIAZIDE 20-12.5 MG PO TABS: 1.0000 | ORAL_TABLET | Freq: Every day | ORAL | 1 refills | Status: DC

## 2021-08-18 MED ORDER — SYNTHROID 75 MCG PO TABS: 75.0000 ug | ORAL_TABLET | Freq: Every day | ORAL | 1 refills | Status: DC

## 2021-08-24 ENCOUNTER — Other Ambulatory Visit: Payer: Self-pay | Admitting: Nurse Practitioner

## 2021-08-24 ENCOUNTER — Telehealth: Payer: Self-pay | Admitting: Nurse Practitioner

## 2021-08-24 DIAGNOSIS — I10 Essential (primary) hypertension: Secondary | ICD-10-CM

## 2021-08-24 DIAGNOSIS — E039 Hypothyroidism, unspecified: Secondary | ICD-10-CM

## 2021-08-24 MED ORDER — SYNTHROID 75 MCG PO TABS: 75.0000 ug | ORAL_TABLET | Freq: Every day | ORAL | 1 refills | Status: DC

## 2021-08-24 MED ORDER — BENAZEPRIL-HYDROCHLOROTHIAZIDE 20-12.5 MG PO TABS: 1.0000 | ORAL_TABLET | Freq: Every day | ORAL | 1 refills | Status: DC

## 2021-08-24 NOTE — Telephone Encounter (Signed)
Patient aware.

## 2021-08-24 NOTE — Telephone Encounter (Signed)
The two prescriptions that were printed today need to be sent to [facility] will need them by next week she will be out. Please advise.

## 2021-08-24 NOTE — Progress Notes (Signed)
Printed out prescriptions for synthroid and lotensin HCT to be sent to [facility].

## 2021-11-18 ENCOUNTER — Telehealth: Payer: Self-pay | Admitting: *Deleted

## 2021-11-18 ENCOUNTER — Other Ambulatory Visit: Payer: Self-pay | Admitting: Nurse Practitioner

## 2021-11-18 DIAGNOSIS — E039 Hypothyroidism, unspecified: Secondary | ICD-10-CM

## 2021-11-18 MED ORDER — SYNTHROID 75 MCG PO TABS: 75.0000 ug | ORAL_TABLET | Freq: Every day | ORAL | 1 refills | Status: DC

## 2021-11-18 NOTE — Telephone Encounter (Signed)
Pt called back and she was informed of below.Eisa Necaise Zimmerman Rumple, CMA  

## 2021-11-18 NOTE — Telephone Encounter (Signed)
I have refilled the synthroid and sent to [facility].

## 2021-11-18 NOTE — Telephone Encounter (Signed)
LVM for pt to call office to inform that her Rx that she requested has been sent in. Chavy Avera Zimmerman Rumple, CMA

## 2021-11-18 NOTE — Telephone Encounter (Signed)
Pt called and is requesting a refill of her [facility]. She said she has about a weeks worth and that if she doesn't get it before she runs out she will call back. Heraclio Seidman Zimmerman Rumple, CMA

## 2022-02-17 ENCOUNTER — Other Ambulatory Visit: Payer: Self-pay | Admitting: Nurse Practitioner

## 2022-02-17 DIAGNOSIS — I10 Essential (primary) hypertension: Secondary | ICD-10-CM

## 2022-05-11 ENCOUNTER — Telehealth: Payer: Self-pay | Admitting: Nurse Practitioner

## 2022-05-11 DIAGNOSIS — E039 Hypothyroidism, unspecified: Secondary | ICD-10-CM

## 2022-05-11 DIAGNOSIS — I10 Essential (primary) hypertension: Secondary | ICD-10-CM

## 2022-05-14 ENCOUNTER — Other Ambulatory Visit: Payer: Self-pay

## 2022-05-14 DIAGNOSIS — I10 Essential (primary) hypertension: Secondary | ICD-10-CM

## 2022-05-14 DIAGNOSIS — E039 Hypothyroidism, unspecified: Secondary | ICD-10-CM

## 2022-05-14 MED ORDER — SYNTHROID 75 MCG PO TABS: 75.0000 ug | ORAL_TABLET | Freq: Every day | ORAL | 0 refills | Status: DC

## 2022-05-14 MED ORDER — BENAZEPRIL-HYDROCHLOROTHIAZIDE 20-12.5 MG PO TABS: 1.0000 | ORAL_TABLET | Freq: Every day | ORAL | 0 refills | Status: DC

## 2022-05-14 NOTE — Telephone Encounter (Signed)
Rx sent in for 11 days until appt 05/25/2022 to [facility] [address]

## 2022-05-14 NOTE — Telephone Encounter (Signed)
Pt is now scheduled to see Lequita Halt on 05/25/22.

## 2022-05-25 ENCOUNTER — Encounter: Payer: Self-pay | Admitting: Family Medicine

## 2022-05-25 ENCOUNTER — Ambulatory Visit (INDEPENDENT_AMBULATORY_CARE_PROVIDER_SITE_OTHER): Payer: 59 | Admitting: Family Medicine

## 2022-05-25 VITALS — BP 123/86 | HR 70 | Resp 18 | Ht 59.45 in | Wt 135.0 lb

## 2022-05-25 DIAGNOSIS — Z1212 Encounter for screening for malignant neoplasm of rectum: Secondary | ICD-10-CM | POA: Diagnosis not present

## 2022-05-25 DIAGNOSIS — I10 Essential (primary) hypertension: Secondary | ICD-10-CM

## 2022-05-25 DIAGNOSIS — E039 Hypothyroidism, unspecified: Secondary | ICD-10-CM

## 2022-05-25 DIAGNOSIS — Z1211 Encounter for screening for malignant neoplasm of colon: Secondary | ICD-10-CM | POA: Diagnosis not present

## 2022-05-25 MED ORDER — BENAZEPRIL-HYDROCHLOROTHIAZIDE 20-12.5 MG PO TABS: 1.0000 | ORAL_TABLET | Freq: Every day | ORAL | 1 refills | Status: DC

## 2022-05-25 MED ORDER — SYNTHROID 75 MCG PO TABS: 75.0000 ug | ORAL_TABLET | Freq: Every day | ORAL | 1 refills | Status: DC

## 2022-05-25 NOTE — Progress Notes (Addendum)
   Established Patient Office Visit  Subjective   Patient ID: Kathleen Underwood, female    DOB: 11-05-1960  Age: 62 y.o. MRN: [id number]  Chief Complaint  Patient presents with   Hypertension   Hypothyroidism    HPI Kathleen Underwood is a 62 y.o. female presenting today for follow up of hypertension, hypothyroidism. Hypertension: Patient here for follow-up of elevated blood pressure.  Pt denies chest pain, SOB, dizziness, edema, syncope, fatigue or heart palpitations. Taking benazepril-hydrochlorothiazide, reports excellent compliance with treatment. Denies side effects. Hypothyroidism: Taking Synthroid 75 mcg regularly in the AM away from food and vitamins. Denies fatigue, weight changes, heat/cold intolerance, skin/hair changes, bowel changes, CVS symptoms.  ROS Negative unless otherwise noted in HPI   Objective:     BP 123/86 (BP Location: Left Arm, Patient Position: Sitting, Cuff Size: Normal)   Pulse 70   Resp 18   Ht 4' 11.45" (1.51 m)   Wt 135 lb (61.2 kg)   SpO2 96%   BMI 26.86 kg/m   Physical Exam Constitutional:      General: She is not in acute distress.    Appearance: Normal appearance.  HENT:     Head: Normocephalic and atraumatic.  Neck:     Thyroid: No thyroid mass, thyromegaly or thyroid tenderness.  Cardiovascular:     Rate and Rhythm: Normal rate and regular rhythm.     Heart sounds: No murmur heard.    No friction rub. No gallop.  Pulmonary:     Effort: Pulmonary effort is normal. No respiratory distress.     Breath sounds: No wheezing, rhonchi or rales.  Lymphadenopathy:     Cervical: No cervical adenopathy.  Skin:    General: Skin is warm and dry.  Neurological:     Mental Status: She is alert and oriented to person, place, and time.     Assessment & Plan:  Essential hypertension Assessment & Plan: BP goal <140/90.  Stable.  Continue benazepril-hydrochlorothiazide 20-12.5 mg daily.  Collecting CMP for medication monitoring.  Will continue to  monitor.  Orders: -     CBC with Differential/Platelet; Future -     Comprehensive metabolic panel; Future -     Benazepril-hydroCHLOROthiazide; Take 1 tablet by mouth daily.  Dispense: 90 tablet; Refill: 1  Acquired hypothyroidism Assessment & Plan: Most recent TSH within normal limits, repeating thyroid function test today.  Continue Synthroid 75 mcg daily unless lab results warrant change.  Orders: -     TSH; Future -     T4, free; Future -     Synthroid; Take 1 tablet (75 mcg total) by mouth daily.  Dispense: 90 tablet; Refill: 1  Screening for colorectal cancer -     Ambulatory referral to Gastroenterology    Return in about 1 day (around 05/26/2022) for blood work (not fasting).    Melida Quitter, PA

## 2022-05-25 NOTE — Assessment & Plan Note (Signed)
Most recent TSH within normal limits, repeating thyroid function test today.  Continue Synthroid 75 mcg daily unless lab results warrant change.

## 2022-05-25 NOTE — Patient Instructions (Signed)
Come back for a lab appointment in the next week or so.  You do not need to be fasting for that lab appointment.   In about 6 months, you will have your annual physical so you can schedule that on your way out today, too.

## 2022-05-25 NOTE — Assessment & Plan Note (Signed)
BP goal <140/90.  Stable.  Continue benazepril-hydrochlorothiazide 20-12.5 mg daily.  Collecting CMP for medication monitoring.  Will continue to monitor.

## 2022-05-26 ENCOUNTER — Other Ambulatory Visit: Payer: 59

## 2022-05-26 DIAGNOSIS — I10 Essential (primary) hypertension: Secondary | ICD-10-CM

## 2022-05-26 DIAGNOSIS — E039 Hypothyroidism, unspecified: Secondary | ICD-10-CM

## 2022-05-27 LAB — CBC WITH DIFFERENTIAL/PLATELET
Basophils Absolute: 0.1 10*3/uL (ref 0.0–0.2)
Basos: 1 %
EOS (ABSOLUTE): 0.1 10*3/uL (ref 0.0–0.4)
Eos: 2 %
Hematocrit: 42.2 % (ref 34.0–46.6)
Hemoglobin: 14.6 g/dL (ref 11.1–15.9)
Immature Grans (Abs): 0 10*3/uL (ref 0.0–0.1)
Immature Granulocytes: 0 %
Lymphocytes Absolute: 1.9 10*3/uL (ref 0.7–3.1)
Lymphs: 31 %
MCH: 32 pg (ref 26.6–33.0)
MCHC: 34.6 g/dL (ref 31.5–35.7)
MCV: 93 fL (ref 79–97)
Monocytes Absolute: 0.5 10*3/uL (ref 0.1–0.9)
Monocytes: 8 %
Neutrophils Absolute: 3.4 10*3/uL (ref 1.4–7.0)
Neutrophils: 58 %
Platelets: 236 10*3/uL (ref 150–450)
RBC: 4.56 x10E6/uL (ref 3.77–5.28)
RDW: 12.4 % (ref 11.7–15.4)
WBC: 6 10*3/uL (ref 3.4–10.8)

## 2022-05-27 LAB — COMPREHENSIVE METABOLIC PANEL
ALT: 19 IU/L (ref 0–32)
AST: 22 IU/L (ref 0–40)
Albumin/Globulin Ratio: 1.8 (ref 1.2–2.2)
Albumin: 4.4 g/dL (ref 3.9–4.9)
Alkaline Phosphatase: 87 IU/L (ref 44–121)
BUN/Creatinine Ratio: 16 (ref 12–28)
BUN: 14 mg/dL (ref 8–27)
Bilirubin Total: 0.6 mg/dL (ref 0.0–1.2)
CO2: 26 mmol/L (ref 20–29)
Calcium: 10 mg/dL (ref 8.7–10.3)
Chloride: 99 mmol/L (ref 96–106)
Creatinine, Ser: 0.86 mg/dL (ref 0.57–1.00)
Globulin, Total: 2.5 g/dL (ref 1.5–4.5)
Glucose: 81 mg/dL (ref 70–99)
Potassium: 4.1 mmol/L (ref 3.5–5.2)
Sodium: 138 mmol/L (ref 134–144)
Total Protein: 6.9 g/dL (ref 6.0–8.5)
eGFR: 76 mL/min/{1.73_m2} (ref >59)

## 2022-05-27 LAB — T4, FREE: Free T4: 1.69 ng/dL (ref 0.82–1.77)

## 2022-05-27 LAB — TSH: TSH: 1.62 u[IU]/mL (ref 0.450–4.500)

## 2022-07-26 ENCOUNTER — Other Ambulatory Visit: Payer: Self-pay | Admitting: Nurse Practitioner

## 2022-07-26 DIAGNOSIS — I10 Essential (primary) hypertension: Secondary | ICD-10-CM

## 2022-07-26 DIAGNOSIS — E039 Hypothyroidism, unspecified: Secondary | ICD-10-CM

## 2022-09-01 DIAGNOSIS — Z1231 Encounter for screening mammogram for malignant neoplasm of breast: Secondary | ICD-10-CM | POA: Diagnosis not present

## 2022-09-01 LAB — HM MAMMOGRAPHY

## 2022-09-07 ENCOUNTER — Encounter: Payer: Self-pay | Admitting: Family Medicine

## 2022-11-08 ENCOUNTER — Other Ambulatory Visit: Payer: Self-pay

## 2022-11-08 DIAGNOSIS — I10 Essential (primary) hypertension: Secondary | ICD-10-CM

## 2022-11-08 DIAGNOSIS — Z0000 Encounter for general adult medical examination without abnormal findings: Secondary | ICD-10-CM

## 2022-11-18 ENCOUNTER — Other Ambulatory Visit: Payer: 59

## 2022-11-18 DIAGNOSIS — Z0000 Encounter for general adult medical examination without abnormal findings: Secondary | ICD-10-CM

## 2022-11-18 DIAGNOSIS — I10 Essential (primary) hypertension: Secondary | ICD-10-CM

## 2022-11-19 LAB — CBC WITH DIFFERENTIAL/PLATELET
Basophils Absolute: 0.1 10*3/uL (ref 0.0–0.2)
Basos: 1 %
EOS (ABSOLUTE): 0.2 10*3/uL (ref 0.0–0.4)
Eos: 3 %
Hematocrit: 45.2 % (ref 34.0–46.6)
Hemoglobin: 15.1 g/dL (ref 11.1–15.9)
Immature Grans (Abs): 0 10*3/uL (ref 0.0–0.1)
Immature Granulocytes: 0 %
Lymphocytes Absolute: 2.1 10*3/uL (ref 0.7–3.1)
Lymphs: 29 %
MCH: 31.7 pg (ref 26.6–33.0)
MCHC: 33.4 g/dL (ref 31.5–35.7)
MCV: 95 fL (ref 79–97)
Monocytes Absolute: 0.6 10*3/uL (ref 0.1–0.9)
Monocytes: 8 %
Neutrophils Absolute: 4.1 10*3/uL (ref 1.4–7.0)
Neutrophils: 59 %
Platelets: 312 10*3/uL (ref 150–450)
RBC: 4.76 x10E6/uL (ref 3.77–5.28)
RDW: 11.9 % (ref 11.7–15.4)
WBC: 7 10*3/uL (ref 3.4–10.8)

## 2022-11-19 LAB — COMPREHENSIVE METABOLIC PANEL
ALT: 24 [IU]/L (ref 0–32)
AST: 28 [IU]/L (ref 0–40)
Albumin: 4.5 g/dL (ref 3.9–4.9)
Alkaline Phosphatase: 90 [IU]/L (ref 44–121)
BUN/Creatinine Ratio: 15 (ref 12–28)
BUN: 13 mg/dL (ref 8–27)
Bilirubin Total: 0.7 mg/dL (ref 0.0–1.2)
CO2: 25 mmol/L (ref 20–29)
Calcium: 10.4 mg/dL — ABNORMAL HIGH (ref 8.7–10.3)
Chloride: 98 mmol/L (ref 96–106)
Creatinine, Ser: 0.89 mg/dL (ref 0.57–1.00)
Globulin, Total: 2.9 g/dL (ref 1.5–4.5)
Glucose: 82 mg/dL (ref 70–99)
Potassium: 4.3 mmol/L (ref 3.5–5.2)
Sodium: 140 mmol/L (ref 134–144)
Total Protein: 7.4 g/dL (ref 6.0–8.5)
eGFR: 73 mL/min/{1.73_m2} (ref >59)

## 2022-11-19 LAB — LIPID PANEL
Chol/HDL Ratio: 4 ratio (ref 0.0–4.4)
Cholesterol, Total: 232 mg/dL — ABNORMAL HIGH (ref 100–199)
HDL: 58 mg/dL (ref >39)
LDL Chol Calc (NIH): 145 mg/dL — ABNORMAL HIGH (ref 0–99)
Triglycerides: 161 mg/dL — ABNORMAL HIGH (ref 0–149)
VLDL Cholesterol Cal: 29 mg/dL (ref 5–40)

## 2022-11-19 LAB — HEMOGLOBIN A1C
Est. average glucose Bld gHb Est-mCnc: 108 mg/dL
Hgb A1c MFr Bld: 5.4 % (ref 4.8–5.6)

## 2022-11-25 ENCOUNTER — Ambulatory Visit (INDEPENDENT_AMBULATORY_CARE_PROVIDER_SITE_OTHER): Payer: 59 | Admitting: Family Medicine

## 2022-11-25 ENCOUNTER — Encounter: Payer: Self-pay | Admitting: Family Medicine

## 2022-11-25 VITALS — BP 143/102 | HR 75 | Resp 18 | Ht 59.45 in | Wt 135.0 lb

## 2022-11-25 DIAGNOSIS — I10 Essential (primary) hypertension: Secondary | ICD-10-CM | POA: Diagnosis not present

## 2022-11-25 DIAGNOSIS — E782 Mixed hyperlipidemia: Secondary | ICD-10-CM

## 2022-11-25 DIAGNOSIS — Z23 Encounter for immunization: Secondary | ICD-10-CM | POA: Diagnosis not present

## 2022-11-25 DIAGNOSIS — Z1159 Encounter for screening for other viral diseases: Secondary | ICD-10-CM

## 2022-11-25 DIAGNOSIS — E039 Hypothyroidism, unspecified: Secondary | ICD-10-CM | POA: Diagnosis not present

## 2022-11-25 DIAGNOSIS — Z0000 Encounter for general adult medical examination without abnormal findings: Secondary | ICD-10-CM | POA: Diagnosis not present

## 2022-11-25 MED ORDER — BENAZEPRIL-HYDROCHLOROTHIAZIDE 20-12.5 MG PO TABS: 1.0000 | ORAL_TABLET | Freq: Every day | ORAL | 1 refills | Status: AC

## 2022-11-25 MED ORDER — SYNTHROID 75 MCG PO TABS: 75.0000 ug | ORAL_TABLET | Freq: Every day | ORAL | 1 refills | Status: DC

## 2022-11-25 NOTE — Progress Notes (Signed)
Complete physical exam  Patient: Kathleen Underwood   DOB: [birth year]   62 y.o. Female  MRN: [id number]  Subjective:    Chief Complaint  Patient presents with   Annual Exam    Kathleen Underwood is a 62 y.o. female who presents today for a complete physical exam. She reports consuming a general diet, eating mostly vegetables from her own garden.  She states that they do not go out to eat very often at all.  She lives close to her grandchildren and spends a lot of time with them.  She also walks 3 miles occasionally.  She generally feels well.  She does not have additional problems to discuss today.    Most recent fall risk assessment:    05/25/2022    1:54 PM  Fall Risk   Falls in the past year? 0  Number falls in past yr: 0  Injury with Fall? 0  Risk for fall due to : No Fall Risks  Follow up Falls evaluation completed     Most recent depression and anxiety screenings:    11/25/2022    9:11 AM 07/08/2021   10:45 AM  PHQ 2/9 Scores  PHQ - 2 Score 0 0  PHQ- 9 Score 0 0      07/08/2021   10:46 AM 04/14/2021    3:35 PM  GAD 7 : Generalized Anxiety Score  Nervous, Anxious, on Edge 0 0  Control/stop worrying 0 0  Worry too much - different things 0 0  Trouble relaxing 0 0  Restless 0 0  Easily annoyed or irritable 0 0  Afraid - awful might happen 0 0  Total GAD 7 Score 0 0    Patient Active Problem List   Diagnosis Date Noted   Mixed hyperlipidemia 11/25/2022   Vitamin D deficiency 07/08/2021   Essential hypertension 04/19/2021   Body mass index 27.0-27.9, adult 04/19/2021   Acquired hypothyroidism 04/19/2021    Past Surgical History:  Procedure Laterality Date   ABDOMINAL HYSTERECTOMY     JOINT ASPIRATION KNEE  11/10/2013       Social History   Tobacco Use   Smoking status: Never    Passive exposure: Never   Smokeless tobacco: Never  Substance Use Topics   Alcohol use: No   Drug use: No   Family History  Problem Relation Age of Onset   High blood  pressure Mother    High blood pressure Father    Allergies  Allergen Reactions   Covid-19 (Mrna) Vaccine Rash     Patient Care Team: Melida Quitter, PA as PCP - General (Family Medicine)   Outpatient Medications Prior to Visit  Medication Sig   famotidine (PEPCID) 20 MG tablet 1 tablet as needed at bedtime   [DISCONTINUED] benazepril-hydrochlorthiazide (LOTENSIN HCT) 20-12.5 MG tablet TAKE 1 TABLET BY MOUTH EVERY DAY   [DISCONTINUED] SYNTHROID 75 MCG tablet TAKE 1 TABLET BY MOUTH EVERY DAY   No facility-administered medications prior to visit.    Review of Systems  Constitutional:  Negative for chills, fever and malaise/fatigue.  HENT:  Negative for congestion and hearing loss.   Eyes:  Negative for blurred vision and double vision.  Respiratory:  Negative for cough and shortness of breath.   Cardiovascular:  Negative for chest pain, palpitations and leg swelling.  Gastrointestinal:  Negative for abdominal pain, constipation, diarrhea and heartburn.  Genitourinary:  Negative for frequency and urgency.  Musculoskeletal:  Negative for myalgias and neck pain.  Neurological:  Negative for headaches.  Endo/Heme/Allergies:  Negative for polydipsia.  Psychiatric/Behavioral:  Negative for depression. The patient is not nervous/anxious.       Objective:    BP (!) 143/102 (BP Location: Left Arm, Patient Position: Sitting, Cuff Size: Normal)   Pulse 75   Resp 18   Ht 4' 11.45" (1.51 m)   Wt 135 lb (61.2 kg)   SpO2 97%   BMI 26.86 kg/m    Physical Exam Constitutional:      General: She is not in acute distress.    Appearance: Normal appearance.  HENT:     Head: Normocephalic and atraumatic.     Right Ear: Tympanic membrane, ear canal and external ear normal.     Left Ear: Tympanic membrane, ear canal and external ear normal.     Nose: Nose normal.     Mouth/Throat:     Mouth: Mucous membranes are moist.     Pharynx: No oropharyngeal exudate or posterior oropharyngeal  erythema.  Eyes:     Extraocular Movements: Extraocular movements intact.     Conjunctiva/sclera: Conjunctivae normal.     Pupils: Pupils are equal, round, and reactive to light.     Comments: Wearing contacts today, also has glasses  Neck:     Thyroid: No thyroid mass, thyromegaly or thyroid tenderness.  Cardiovascular:     Rate and Rhythm: Normal rate and regular rhythm.     Heart sounds: Normal heart sounds. No murmur heard.    No friction rub. No gallop.  Pulmonary:     Effort: Pulmonary effort is normal. No respiratory distress.     Breath sounds: Normal breath sounds. No wheezing, rhonchi or rales.  Abdominal:     General: Abdomen is flat. Bowel sounds are normal. There is no distension.     Palpations: There is no mass.     Tenderness: There is no abdominal tenderness. There is no guarding.  Musculoskeletal:        General: Normal range of motion.     Cervical back: Normal range of motion and neck supple.  Lymphadenopathy:     Cervical: No cervical adenopathy.  Skin:    General: Skin is warm and dry.  Neurological:     Mental Status: She is alert and oriented to person, place, and time.     Cranial Nerves: No cranial nerve deficit.     Motor: No weakness.     Deep Tendon Reflexes: Reflexes normal.  Psychiatric:        Mood and Affect: Mood normal.        Assessment & Plan:    Routine Health Maintenance and Physical Exam  Immunization History  Administered Date(s) Administered   Influenza, Seasonal, Injecte, Preservative Fre 11/25/2022   Influenza,inj,Quad PF,6+ Mos 10/29/2020   PFIZER(Purple Top)SARS-COV-2 Vaccination 03/30/2019, 04/24/2019   Tdap 07/27/2016   Zoster Recombinant(Shingrix) 04/14/2020, 11/19/2020    Health Maintenance  Topic Date Due   HIV Screening  Never done   Hepatitis C Screening  Never done   Colonoscopy  Never done   COVID-19 Vaccine (3 - 2023-24 season) 12/11/2022 (Originally 09/05/2022)   MAMMOGRAM  08/31/2024   Cervical Cancer  Screening (HPV/Pap Cotest)  07/09/2026   DTaP/Tdap/Td (2 - Td or Tdap) 07/28/2026   INFLUENZA VACCINE  Completed   Zoster Vaccines- Shingrix  Completed   HPV VACCINES  Aged Out    Reviewed most recent labs including CBC, CMP, lipid panel, A1C, TSH, and vitamin D. All within normal limits/stable from last  check other than Calcium 10.4 with corrected calcium 10.0, LDL increased to 145, triglycerides increased to 161.  Discussed health benefits of physical activity, and encouraged her to engage in regular exercise appropriate for her age and condition.  Wellness examination  Need for influenza vaccination -     Flu vaccine trivalent PF, 6mos and older(Flulaval,Afluria,Fluarix,Fluzone)  Acquired hypothyroidism Assessment & Plan: Most recent TSH within normal limits.  Continue Synthroid 75 mcg daily.  Will continue to monitor.  Orders: -     Synthroid; Take 1 tablet (75 mcg total) by mouth daily.  Dispense: 90 tablet; Refill: 1 -     TSH Rfx on Abnormal to Free T4; Future  Essential hypertension Assessment & Plan: BP goal <140/90.  Above goal in office today, continue ambulatory blood pressure monitoring at home.  If consistently above goal as discussed, will contact primary care.  Continue benazepril-hydrochlorothiazide 20-12.5 mg daily.  Will continue to monitor.  Orders: -     Benazepril-hydroCHLOROthiazide; Take 1 tablet by mouth daily.  Dispense: 90 tablet; Refill: 1 -     CBC with Differential/Platelet; Future -     Comprehensive metabolic panel; Future  Mixed hyperlipidemia Assessment & Plan: Lipid panel: LDL 145, HDL 58, triglycerides 161. The 10-year ASCVD risk score (Arnett DK, et al., 2019) is: 9.6%.  Discussed that given risk score, starting statin medication to lower cholesterol levels would be beneficial.  Patient does not want to start medication unless absolutely necessary.  Discussed alternative of CT calcium score, patient agreeable to having this procedure done prior  to initiating medication if needed.  Orders: -     CT CARDIAC SCORING (SELF PAY ONLY); Future -     CBC with Differential/Platelet; Future -     Comprehensive metabolic panel; Future -     Lipid panel; Future    Return in about 6 months (around 05/25/2023) for follow-up for HTN, HLD, thyroid, fasting blood work 1 week before.     Melida Quitter, PA

## 2022-11-25 NOTE — Assessment & Plan Note (Addendum)
Lipid panel: LDL 145, HDL 58, triglycerides 161. The 10-year ASCVD risk score (Arnett DK, et al., 2019) is: 9.6%.  Discussed that given risk score, starting statin medication to lower cholesterol levels would be beneficial.  Patient does not want to start medication unless absolutely necessary.  Discussed alternative of CT calcium score, patient agreeable to having this procedure done prior to initiating medication if needed.

## 2022-11-25 NOTE — Assessment & Plan Note (Signed)
Most recent TSH within normal limits.  Continue Synthroid 75 mcg daily.  Will continue to monitor.

## 2022-11-25 NOTE — Patient Instructions (Signed)
Call to schedule your colonoscopy, then you will be up-to-date on your preventative care!

## 2022-11-25 NOTE — Assessment & Plan Note (Signed)
BP goal <140/90.  Above goal in office today, continue ambulatory blood pressure monitoring at home.  If consistently above goal as discussed, will contact primary care.  Continue benazepril-hydrochlorothiazide 20-12.5 mg daily.  Will continue to monitor.

## 2022-11-30 ENCOUNTER — Ambulatory Visit (HOSPITAL_COMMUNITY)
Admission: RE | Admit: 2022-11-30 | Discharge: 2022-11-30 | Disposition: A | Discharge status: Home / Self Care | Payer: 59 | Source: Ambulatory Visit | Attending: Family Medicine | Admitting: Family Medicine

## 2022-11-30 DIAGNOSIS — E782 Mixed hyperlipidemia: Secondary | ICD-10-CM | POA: Insufficient documentation

## 2023-02-10 ENCOUNTER — Encounter: Payer: Self-pay | Admitting: Family Medicine

## 2023-05-19 ENCOUNTER — Other Ambulatory Visit: Payer: 59

## 2023-05-25 ENCOUNTER — Ambulatory Visit: Payer: 59 | Admitting: Family Medicine

## 2023-06-03 ENCOUNTER — Telehealth: Payer: Self-pay

## 2023-06-03 NOTE — Telephone Encounter (Signed)
 LVM to return call to reschedule follow up that was previously scheduled with Britta Candy to be scheduled with Meryl Acosta, PA

## 2023-06-23 ENCOUNTER — Other Ambulatory Visit: Payer: Self-pay | Admitting: Family Medicine

## 2023-06-23 DIAGNOSIS — E039 Hypothyroidism, unspecified: Secondary | ICD-10-CM
